# Patient Record
Sex: Male | Born: 1950 | Race: White | Hispanic: No | Marital: Married | Smoking: Former smoker
Health system: Southern US, Community
[De-identification: ages and names within clinical notes are randomized; demographics above are authoritative.]

## PROBLEM LIST (undated history)

## (undated) DIAGNOSIS — N400 Enlarged prostate without lower urinary tract symptoms: Secondary | ICD-10-CM

## (undated) DIAGNOSIS — N189 Chronic kidney disease, unspecified: Secondary | ICD-10-CM

## (undated) DIAGNOSIS — E119 Type 2 diabetes mellitus without complications: Secondary | ICD-10-CM

## (undated) DIAGNOSIS — E669 Obesity, unspecified: Secondary | ICD-10-CM

## (undated) DIAGNOSIS — E538 Deficiency of other specified B group vitamins: Secondary | ICD-10-CM

## (undated) DIAGNOSIS — R739 Hyperglycemia, unspecified: Secondary | ICD-10-CM

## (undated) DIAGNOSIS — I2510 Atherosclerotic heart disease of native coronary artery without angina pectoris: Secondary | ICD-10-CM

## (undated) DIAGNOSIS — G4733 Obstructive sleep apnea (adult) (pediatric): Secondary | ICD-10-CM

## (undated) DIAGNOSIS — Z720 Tobacco use: Secondary | ICD-10-CM

## (undated) DIAGNOSIS — E559 Vitamin D deficiency, unspecified: Secondary | ICD-10-CM

## (undated) DIAGNOSIS — M1990 Unspecified osteoarthritis, unspecified site: Secondary | ICD-10-CM

## (undated) DIAGNOSIS — Z8719 Personal history of other diseases of the digestive system: Secondary | ICD-10-CM

## (undated) DIAGNOSIS — I10 Essential (primary) hypertension: Secondary | ICD-10-CM

## (undated) HISTORY — PX: FLEXIBLE SIGMOIDOSCOPY: SHX1649

## (undated) HISTORY — PX: TONSILLECTOMY: SUR1361

## (undated) HISTORY — PX: COLONOSCOPY: SHX174

## (undated) HISTORY — PX: OTHER SURGICAL HISTORY: SHX169

---

## 2005-09-22 ENCOUNTER — Ambulatory Visit: Payer: Self-pay | Admitting: Internal Medicine

## 2008-12-01 ENCOUNTER — Ambulatory Visit: Payer: Self-pay | Admitting: Unknown Physician Specialty

## 2011-09-20 HISTORY — PX: CORONARY ARTERY BYPASS GRAFT: SHX141

## 2012-02-22 ENCOUNTER — Other Ambulatory Visit: Payer: Self-pay | Admitting: Rheumatology

## 2012-02-22 LAB — BODY FLUID CELL COUNT WITH DIFFERENTIAL
Basophil: 0 %
Eosinophil: 0 %
Lymphocytes: 92 %
Neutrophils: 3 %
Nucleated Cell Count: 103 /mm3

## 2012-02-22 LAB — SYNOVIAL FLUID, CRYSTAL

## 2012-04-16 ENCOUNTER — Ambulatory Visit: Payer: Self-pay | Admitting: Unknown Physician Specialty

## 2012-08-27 ENCOUNTER — Ambulatory Visit: Payer: Self-pay | Admitting: Internal Medicine

## 2012-10-24 ENCOUNTER — Encounter: Payer: Self-pay | Admitting: Internal Medicine

## 2012-11-17 ENCOUNTER — Encounter: Payer: Self-pay | Admitting: Internal Medicine

## 2012-12-18 ENCOUNTER — Encounter: Payer: Self-pay | Admitting: Internal Medicine

## 2015-08-12 ENCOUNTER — Other Ambulatory Visit: Payer: Self-pay | Admitting: Internal Medicine

## 2015-08-12 DIAGNOSIS — M25561 Pain in right knee: Secondary | ICD-10-CM

## 2015-08-12 DIAGNOSIS — R9389 Abnormal findings on diagnostic imaging of other specified body structures: Secondary | ICD-10-CM

## 2015-08-21 ENCOUNTER — Ambulatory Visit
Admission: RE | Admit: 2015-08-21 | Discharge: 2015-08-21 | Disposition: A | Discharge status: Home / Self Care | Payer: Medicare Other | Source: Ambulatory Visit | Attending: Internal Medicine | Admitting: Internal Medicine

## 2015-08-21 DIAGNOSIS — M25561 Pain in right knee: Secondary | ICD-10-CM | POA: Diagnosis present

## 2015-08-21 DIAGNOSIS — R9389 Abnormal findings on diagnostic imaging of other specified body structures: Secondary | ICD-10-CM

## 2015-08-21 DIAGNOSIS — R938 Abnormal findings on diagnostic imaging of other specified body structures: Secondary | ICD-10-CM | POA: Diagnosis present

## 2015-08-21 DIAGNOSIS — I2510 Atherosclerotic heart disease of native coronary artery without angina pectoris: Secondary | ICD-10-CM | POA: Diagnosis not present

## 2015-08-21 HISTORY — DX: Essential (primary) hypertension: I10

## 2015-08-21 MED ORDER — IOHEXOL 300 MG/ML  SOLN
75.0000 mL | Freq: Once | INTRAMUSCULAR | Status: AC | PRN
  Administered 2015-08-21: 75 mL via INTRAVENOUS

## 2016-12-13 ENCOUNTER — Other Ambulatory Visit: Payer: Self-pay | Admitting: Internal Medicine

## 2016-12-13 DIAGNOSIS — R1310 Dysphagia, unspecified: Secondary | ICD-10-CM

## 2016-12-22 ENCOUNTER — Ambulatory Visit
Admission: RE | Admit: 2016-12-22 | Discharge: 2016-12-22 | Disposition: A | Discharge status: Home / Self Care | Payer: Medicare Other | Source: Ambulatory Visit | Attending: Internal Medicine | Admitting: Internal Medicine

## 2016-12-22 DIAGNOSIS — R1310 Dysphagia, unspecified: Secondary | ICD-10-CM

## 2016-12-22 DIAGNOSIS — K228 Other specified diseases of esophagus: Secondary | ICD-10-CM | POA: Insufficient documentation

## 2016-12-22 DIAGNOSIS — K449 Diaphragmatic hernia without obstruction or gangrene: Secondary | ICD-10-CM | POA: Insufficient documentation

## 2016-12-29 ENCOUNTER — Other Ambulatory Visit: Payer: Self-pay | Admitting: Internal Medicine

## 2016-12-29 DIAGNOSIS — M5416 Radiculopathy, lumbar region: Secondary | ICD-10-CM

## 2017-01-09 ENCOUNTER — Ambulatory Visit
Admission: RE | Admit: 2017-01-09 | Discharge: 2017-01-09 | Disposition: A | Discharge status: Home / Self Care | Payer: Medicare Other | Source: Ambulatory Visit | Attending: Internal Medicine | Admitting: Internal Medicine

## 2017-01-09 DIAGNOSIS — M5416 Radiculopathy, lumbar region: Secondary | ICD-10-CM

## 2017-01-17 ENCOUNTER — Ambulatory Visit
Admission: RE | Admit: 2017-01-17 | Discharge: 2017-01-17 | Disposition: A | Discharge status: Home / Self Care | Payer: Medicare Other | Source: Ambulatory Visit | Attending: Internal Medicine | Admitting: Internal Medicine

## 2017-01-17 DIAGNOSIS — M5416 Radiculopathy, lumbar region: Secondary | ICD-10-CM | POA: Diagnosis present

## 2017-01-17 DIAGNOSIS — M48061 Spinal stenosis, lumbar region without neurogenic claudication: Secondary | ICD-10-CM | POA: Insufficient documentation

## 2017-01-17 DIAGNOSIS — M5136 Other intervertebral disc degeneration, lumbar region: Secondary | ICD-10-CM | POA: Insufficient documentation

## 2017-07-21 ENCOUNTER — Encounter: Payer: Self-pay | Admitting: *Deleted

## 2017-07-24 ENCOUNTER — Ambulatory Visit: Payer: Medicare Other | Admitting: Anesthesiology

## 2017-07-24 ENCOUNTER — Ambulatory Visit
Admission: RE | Admit: 2017-07-24 | Discharge: 2017-07-24 | Disposition: A | Discharge status: Home / Self Care | Payer: Medicare Other | Source: Ambulatory Visit | Attending: Unknown Physician Specialty | Admitting: Unknown Physician Specialty

## 2017-07-24 ENCOUNTER — Encounter
Admission: RE | Disposition: A | Discharge status: Home / Self Care | Payer: Self-pay | Source: Ambulatory Visit | Attending: Unknown Physician Specialty

## 2017-07-24 ENCOUNTER — Encounter: Payer: Self-pay | Admitting: *Deleted

## 2017-07-24 DIAGNOSIS — E669 Obesity, unspecified: Secondary | ICD-10-CM | POA: Diagnosis not present

## 2017-07-24 DIAGNOSIS — Z79899 Other long term (current) drug therapy: Secondary | ICD-10-CM | POA: Insufficient documentation

## 2017-07-24 DIAGNOSIS — I2510 Atherosclerotic heart disease of native coronary artery without angina pectoris: Secondary | ICD-10-CM | POA: Diagnosis not present

## 2017-07-24 DIAGNOSIS — G4733 Obstructive sleep apnea (adult) (pediatric): Secondary | ICD-10-CM | POA: Insufficient documentation

## 2017-07-24 DIAGNOSIS — E559 Vitamin D deficiency, unspecified: Secondary | ICD-10-CM | POA: Diagnosis not present

## 2017-07-24 DIAGNOSIS — K635 Polyp of colon: Secondary | ICD-10-CM | POA: Insufficient documentation

## 2017-07-24 DIAGNOSIS — N189 Chronic kidney disease, unspecified: Secondary | ICD-10-CM | POA: Diagnosis not present

## 2017-07-24 DIAGNOSIS — Z6830 Body mass index (BMI) 30.0-30.9, adult: Secondary | ICD-10-CM | POA: Insufficient documentation

## 2017-07-24 DIAGNOSIS — Z7982 Long term (current) use of aspirin: Secondary | ICD-10-CM | POA: Diagnosis not present

## 2017-07-24 DIAGNOSIS — M1990 Unspecified osteoarthritis, unspecified site: Secondary | ICD-10-CM | POA: Diagnosis not present

## 2017-07-24 DIAGNOSIS — Z86010 Personal history of colonic polyps: Secondary | ICD-10-CM | POA: Diagnosis not present

## 2017-07-24 DIAGNOSIS — K640 First degree hemorrhoids: Secondary | ICD-10-CM | POA: Insufficient documentation

## 2017-07-24 DIAGNOSIS — Q438 Other specified congenital malformations of intestine: Secondary | ICD-10-CM | POA: Insufficient documentation

## 2017-07-24 DIAGNOSIS — Z1211 Encounter for screening for malignant neoplasm of colon: Secondary | ICD-10-CM | POA: Insufficient documentation

## 2017-07-24 DIAGNOSIS — Z951 Presence of aortocoronary bypass graft: Secondary | ICD-10-CM | POA: Diagnosis not present

## 2017-07-24 DIAGNOSIS — Z888 Allergy status to other drugs, medicaments and biological substances status: Secondary | ICD-10-CM | POA: Insufficient documentation

## 2017-07-24 DIAGNOSIS — N400 Enlarged prostate without lower urinary tract symptoms: Secondary | ICD-10-CM | POA: Insufficient documentation

## 2017-07-24 DIAGNOSIS — I129 Hypertensive chronic kidney disease with stage 1 through stage 4 chronic kidney disease, or unspecified chronic kidney disease: Secondary | ICD-10-CM | POA: Diagnosis not present

## 2017-07-24 DIAGNOSIS — E538 Deficiency of other specified B group vitamins: Secondary | ICD-10-CM | POA: Insufficient documentation

## 2017-07-24 HISTORY — DX: Hyperglycemia, unspecified: R73.9

## 2017-07-24 HISTORY — DX: Obstructive sleep apnea (adult) (pediatric): G47.33

## 2017-07-24 HISTORY — DX: Chronic kidney disease, unspecified: N18.9

## 2017-07-24 HISTORY — DX: Tobacco use: Z72.0

## 2017-07-24 HISTORY — DX: Unspecified osteoarthritis, unspecified site: M19.90

## 2017-07-24 HISTORY — DX: Benign prostatic hyperplasia without lower urinary tract symptoms: N40.0

## 2017-07-24 HISTORY — DX: Vitamin D deficiency, unspecified: E55.9

## 2017-07-24 HISTORY — DX: Atherosclerotic heart disease of native coronary artery without angina pectoris: I25.10

## 2017-07-24 HISTORY — DX: Deficiency of other specified B group vitamins: E53.8

## 2017-07-24 HISTORY — PX: COLONOSCOPY WITH PROPOFOL: SHX5780

## 2017-07-24 SURGERY — COLONOSCOPY WITH PROPOFOL
Anesthesia: General

## 2017-07-24 MED ORDER — LIDOCAINE 2% (20 MG/ML) 5 ML SYRINGE
INTRAMUSCULAR | Status: DC | PRN
  Administered 2017-07-24: 40 mg via INTRAVENOUS

## 2017-07-24 MED ORDER — FENTANYL CITRATE (PF) 100 MCG/2ML IJ SOLN
INTRAMUSCULAR | Status: DC | PRN
  Administered 2017-07-24 (×2): 50 ug via INTRAVENOUS

## 2017-07-24 MED ORDER — PROPOFOL 10 MG/ML IV BOLUS
INTRAVENOUS | Status: AC
  Filled 2017-07-24: qty 20

## 2017-07-24 MED ORDER — PROPOFOL 10 MG/ML IV BOLUS
INTRAVENOUS | Status: DC | PRN
  Administered 2017-07-24: 100 mg via INTRAVENOUS

## 2017-07-24 MED ORDER — PROPOFOL 500 MG/50ML IV EMUL
INTRAVENOUS | Status: DC | PRN
  Administered 2017-07-24: 160 ug/kg/min via INTRAVENOUS

## 2017-07-24 MED ORDER — FENTANYL CITRATE (PF) 100 MCG/2ML IJ SOLN
INTRAMUSCULAR | Status: AC
  Filled 2017-07-24: qty 2

## 2017-07-24 MED ORDER — SODIUM CHLORIDE 0.9 % IV SOLN
INTRAVENOUS | Status: DC
  Administered 2017-07-24 (×2): via INTRAVENOUS

## 2017-07-24 NOTE — Anesthesia Postprocedure Evaluation (Signed)
Anesthesia Post Note  Patient: Andrew Marquez  Procedure(s) Performed: COLONOSCOPY WITH PROPOFOL (N/A )  Patient location during evaluation: Endoscopy Anesthesia Type: General Level of consciousness: awake and alert Pain management: pain level controlled Vital Signs Assessment: post-procedure vital signs reviewed and stable Respiratory status: spontaneous breathing and respiratory function stable Cardiovascular status: stable Anesthetic complications: no     Last Vitals:  Vitals:   07/24/17 1650 07/24/17 1659  BP: 134/83 (!) 146/98  Pulse: 68 65  Resp: 11 11  Temp: (!) 36.4 C   SpO2: 97% 98%    Last Pain:  Vitals:   07/24/17 1649  TempSrc: Tympanic                 Ziaire Hagos K

## 2017-07-24 NOTE — Op Note (Signed)
[facility] Gastroenterology Patient Name: Andrew Marquez Procedure Date: 07/24/2017 4:04 PM MRN: [id number] Account #: [id number] Date of Birth: 10/21/1950 Admit Type: Outpatient Age: 66 Room: [facility] ENDO ROOM 1 Gender: Male Note Status: Finalized Procedure:            Colonoscopy Indications:          High risk colon cancer surveillance: Personal history                        of colonic polyps Providers:            Manya Silvas, MD Referring MD:         Leonie Douglas. Doy Hutching, MD (Referring MD) Medicines:            Propofol per Anesthesia Complications:        No immediate complications. Procedure:            Pre-Anesthesia Assessment:                       - After reviewing the risks and benefits, the patient                        was deemed in satisfactory condition to undergo the                        procedure.                       After obtaining informed consent, the colonoscope was                        passed under direct vision. Throughout the procedure,                        the patient's blood pressure, pulse, and oxygen                        saturations were monitored continuously. The                        Colonoscope was introduced through the anus and                        advanced to the the cecum, identified by appendiceal                        orifice and ileocecal valve. The colonoscopy was                        somewhat difficult due to a tortuous colon. Successful                        completion of the procedure was aided by applying                        abdominal pressure. The patient tolerated the procedure                        well. The quality of the bowel preparation was good. Findings:      A diminutive polyp was found in the sigmoid colon. The  polyp was       sessile. The polyp was removed with a jumbo cold forceps. Resection and       retrieval were complete.      Internal hemorrhoids were found during endoscopy.  The hemorrhoids were       small and Grade I (internal hemorrhoids that do not prolapse).      The exam was otherwise without abnormality. Impression:           - One diminutive polyp in the sigmoid colon, removed                        with a jumbo cold forceps. Resected and retrieved.                       - Internal hemorrhoids.                       - The examination was otherwise normal. Recommendation:       - Await pathology results. Manya Silvas, MD 07/24/2017 4:41:18 PM This report has been signed electronically. Number of Addenda: 0 Note Initiated On: 07/24/2017 4:04 PM Scope Withdrawal Time: 0 hours 10 minutes 23 seconds  Total Procedure Duration: 0 hours 27 minutes 50 seconds       [facility]

## 2017-07-24 NOTE — Anesthesia Preprocedure Evaluation (Signed)
Anesthesia Evaluation  Patient identified by MRN, date of birth, ID band Patient awake    Reviewed: Allergy & Precautions, NPO status , Patient's Chart, lab work & pertinent test results  History of Anesthesia Complications Negative for: history of anesthetic complications  Airway Mallampati: II  TM Distance: >3 FB Neck ROM: Full    Dental no notable dental hx.    Pulmonary sleep apnea and Continuous Positive Airway Pressure Ventilation , neg COPD,    breath sounds clear to auscultation- rhonchi (-) wheezing      Cardiovascular hypertension, + CAD and + CABG (2013)  (-) Past MI and (-) Cardiac Stents  Rhythm:Regular Rate:Normal - Systolic murmurs and - Diastolic murmurs    Neuro/Psych negative neurological ROS  negative psych ROS   GI/Hepatic negative GI ROS, Neg liver ROS,   Endo/Other  negative endocrine ROSneg diabetes  Renal/GU Renal InsufficiencyRenal disease     Musculoskeletal  (+) Arthritis ,   Abdominal (+) + obese,   Peds  Hematology negative hematology ROS (+)   Anesthesia Other Findings Past Medical History: No date: B12 deficiency No date: BPH (benign prostatic hyperplasia) No date: CAD (coronary artery disease) No date: CKD (chronic kidney disease) No date: Hyperglycemia No date: Hypertension No date: OSA (obstructive sleep apnea) No date: Osteoarthritis No date: Tobacco abuse No date: Vitamin D deficiency   Reproductive/Obstetrics                             Anesthesia Physical Anesthesia Plan  ASA: III  Anesthesia Plan: General   Post-op Pain Management:    Induction: Intravenous  PONV Risk Score and Plan: 1 and Propofol infusion  Airway Management Planned: Natural Airway  Additional Equipment:   Intra-op Plan:   Post-operative Plan:   Informed Consent: I have reviewed the patients History and Physical, chart, labs and discussed the procedure  including the risks, benefits and alternatives for the proposed anesthesia with the patient or authorized representative who has indicated his/her understanding and acceptance.   Dental advisory given  Plan Discussed with: CRNA and Anesthesiologist  Anesthesia Plan Comments:         Anesthesia Quick Evaluation

## 2017-07-24 NOTE — Transfer of Care (Signed)
Immediate Anesthesia Transfer of Care Note  Patient: Andrew Marquez  Procedure(s) Performed: COLONOSCOPY WITH PROPOFOL (N/A )  Patient Location: PACU and Endoscopy Unit  Anesthesia Type:General  Level of Consciousness: awake, oriented and patient cooperative  Airway & Oxygen Therapy: Patient Spontanous Breathing and Patient connected to nasal cannula oxygen  Post-op Assessment: Report given to RN and Post -op Vital signs reviewed and stable  Post vital signs: Reviewed and stable  Last Vitals:  Vitals:   07/24/17 1442 07/24/17 1649  BP: (!) 145/92   Pulse: 66   Resp: 20   Temp: 36.8 C (!) 36.4 C  SpO2: 99%     Last Pain:  Vitals:   07/24/17 1649  TempSrc: Tympanic         Complications: No apparent anesthesia complications

## 2017-07-24 NOTE — Anesthesia Post-op Follow-up Note (Signed)
Anesthesia QCDR form completed.        

## 2017-07-27 LAB — SURGICAL PATHOLOGY

## 2017-07-28 NOTE — H&P (Signed)
   Primary Care Physician:  Idelle Crouch, MD Primary Gastroenterologist:  Dr. Vira Agar  Pre-Procedure History & Physical: HPI:  Andrew Marquez is a 66 y.o. male is here for an colonoscopy.   Past Medical History:  Diagnosis Date  . B12 deficiency   . BPH (benign prostatic hyperplasia)   . CAD (coronary artery disease)   . CKD (chronic kidney disease)   . Hyperglycemia   . Hypertension   . OSA (obstructive sleep apnea)   . Osteoarthritis   . Tobacco abuse   . Vitamin D deficiency     Past Surgical History:  Procedure Laterality Date  . Cardiac Bypass    . COLONOSCOPY    . FLEXIBLE SIGMOIDOSCOPY    . TONSILLECTOMY    . Transesophageal Echocardiography      Prior to Admission medications   Medication Sig Start Date End Date Taking? Authorizing Provider  amLODipine-benazepril (LOTREL) 5-40 MG capsule Take 1 capsule by mouth daily.   Yes [provider]  aspirin EC 81 MG tablet Take 81 mg by mouth daily.   Yes [provider]  pravastatin (PRAVACHOL) 80 MG tablet Take 80 mg by mouth daily.   Yes [provider]    Allergies as of 05/15/2017  . (No Known Allergies)    History reviewed. No pertinent family history.  Social History   Socioeconomic History  . Marital status: Married    Spouse name: Not on file  . Number of children: Not on file  . Years of education: Not on file  . Highest education level: Not on file  Social Needs  . Financial resource strain: Not on file  . Food insecurity - worry: Not on file  . Food insecurity - inability: Not on file  . Transportation needs - medical: Not on file  . Transportation needs - non-medical: Not on file  Occupational History  . Not on file  Tobacco Use  . Smoking status: Never Smoker  . Smokeless tobacco: Never Used  Substance and Sexual Activity  . Alcohol use: Yes    Comment: occasional  . Drug use: No  . Sexual activity: Not on file  Other Topics Concern  . Not on file   Social History Narrative  . Not on file    Review of Systems: See HPI, otherwise negative ROS  Physical Exam: BP (!) 146/98   Pulse 65   Temp (!) 97.5 F (36.4 C)   Resp 11   Ht 5\' 8"  (1.727 m)   Wt 90.7 kg (200 lb)   SpO2 98%   BMI 30.41 kg/m  General:   Alert,  pleasant and cooperative in NAD Head:  Normocephalic and atraumatic. Neck:  Supple; no masses or thyromegaly. Lungs:  Clear throughout to auscultation.    Heart:  Regular rate and rhythm. Abdomen:  Soft, nontender and nondistended. Normal bowel sounds, without guarding, and without rebound.   Neurologic:  Alert and  oriented x4;  grossly normal neurologically.  Impression/Plan: Andrew Marquez is here for an colonoscopy to be performed for [facility] colon polyps.  Risks, benefits, limitations, and alternatives regarding  colonoscopy have been reviewed with the patient.  Questions have been answered.  All parties agreeable.   Gaylyn Cheers, MD  07/28/2017, 12:44 PM

## 2019-11-02 ENCOUNTER — Ambulatory Visit: Payer: Medicare Other | Attending: Internal Medicine

## 2019-11-02 DIAGNOSIS — Z23 Encounter for immunization: Secondary | ICD-10-CM | POA: Insufficient documentation

## 2019-11-02 NOTE — Progress Notes (Signed)
   Covid-19 Vaccination Clinic  Name:  Andrew Marquez    MRN: [id number] DOB: 07-Jul-1951  11/02/2019  Andrew Marquez was observed post Covid-19 immunization for 30 minutes based on pre-vaccination screening without incidence. He was provided with Vaccine Information Sheet and instruction to access the V-Safe system.   Andrew Marquez was instructed to call 911 with any severe reactions post vaccine: Marland Kitchen Difficulty breathing  . Swelling of your face and throat  . A fast heartbeat  . A bad rash all over your body  . Dizziness and weakness    Immunizations Administered    Name Date Dose VIS Date Route   Pfizer COVID-19 Vaccine 11/02/2019 10:12 AM 0.3 mL 08/30/2019 Intramuscular   Manufacturer: [place]   Lot: [id number]   [place]: [id number]

## 2019-11-23 ENCOUNTER — Ambulatory Visit: Payer: Medicare Other | Attending: Internal Medicine

## 2019-11-23 DIAGNOSIS — Z23 Encounter for immunization: Secondary | ICD-10-CM

## 2019-11-23 NOTE — Progress Notes (Signed)
   Covid-19 Vaccination Clinic  Name:  Andrew Marquez    MRN: [id number] DOB: 16-Mar-1951  11/23/2019  Andrew Marquez was observed post Covid-19 immunization for 15 minutes without incident. He was provided with Vaccine Information Sheet and instruction to access the V-Safe system.   Andrew Marquez was instructed to call 911 with any severe reactions post vaccine: Marland Kitchen Difficulty breathing  . Swelling of face and throat  . A fast heartbeat  . A bad rash all over body  . Dizziness and weakness   Immunizations Administered    Name Date Dose VIS Date Route   Pfizer COVID-19 Vaccine 11/23/2019  8:23 AM 0.3 mL 08/30/2019 Intramuscular   Manufacturer: [place]   Lot: [id number]   [place]: [id number]

## 2020-03-26 ENCOUNTER — Other Ambulatory Visit: Payer: Self-pay | Admitting: Internal Medicine

## 2020-03-26 DIAGNOSIS — M79652 Pain in left thigh: Secondary | ICD-10-CM

## 2020-04-06 ENCOUNTER — Ambulatory Visit
Admission: RE | Admit: 2020-04-06 | Discharge: 2020-04-06 | Disposition: A | Discharge status: Home / Self Care | Payer: Medicare Other | Source: Ambulatory Visit | Attending: Internal Medicine | Admitting: Internal Medicine

## 2020-04-06 ENCOUNTER — Other Ambulatory Visit: Payer: Self-pay

## 2020-04-06 DIAGNOSIS — M79652 Pain in left thigh: Secondary | ICD-10-CM | POA: Diagnosis not present

## 2020-06-18 ENCOUNTER — Other Ambulatory Visit: Payer: Self-pay | Admitting: Orthopedic Surgery

## 2020-06-18 DIAGNOSIS — M12811 Other specific arthropathies, not elsewhere classified, right shoulder: Secondary | ICD-10-CM

## 2020-07-12 ENCOUNTER — Ambulatory Visit
Admission: RE | Admit: 2020-07-12 | Discharge: 2020-07-12 | Disposition: A | Discharge status: Home / Self Care | Payer: Medicare Other | Source: Ambulatory Visit | Attending: Orthopedic Surgery | Admitting: Orthopedic Surgery

## 2020-07-12 ENCOUNTER — Other Ambulatory Visit: Payer: Self-pay

## 2020-07-12 DIAGNOSIS — M75101 Unspecified rotator cuff tear or rupture of right shoulder, not specified as traumatic: Secondary | ICD-10-CM

## 2020-07-14 ENCOUNTER — Other Ambulatory Visit: Payer: 59

## 2020-08-02 ENCOUNTER — Other Ambulatory Visit: Payer: 59

## 2020-08-07 ENCOUNTER — Other Ambulatory Visit: Payer: Self-pay | Admitting: Surgery

## 2020-08-19 ENCOUNTER — Other Ambulatory Visit: Payer: Self-pay

## 2020-08-19 ENCOUNTER — Other Ambulatory Visit
Admission: RE | Admit: 2020-08-19 | Discharge: 2020-08-19 | Disposition: A | Discharge status: Home / Self Care | Payer: Medicare Other | Source: Ambulatory Visit | Attending: Surgery | Admitting: Surgery

## 2020-08-19 DIAGNOSIS — Z01812 Encounter for preprocedural laboratory examination: Secondary | ICD-10-CM | POA: Insufficient documentation

## 2020-08-19 HISTORY — DX: Type 2 diabetes mellitus without complications: E11.9

## 2020-08-19 HISTORY — DX: Obesity, unspecified: E66.9

## 2020-08-19 HISTORY — DX: Personal history of other diseases of the digestive system: Z87.19

## 2020-08-19 NOTE — Patient Instructions (Addendum)
INSTRUCTIONS FOR SURGERY     Your surgery is scheduled for:   Wednesday, December 22nd      To find out your arrival time for the day of surgery,          please call [phone] between 1 pm and 3 pm on :  Tuesday, December 21st     When you arrive for surgery, report to the [facility].  ONCE YOU         ARE DONE THERE, YOU WILL GO TO THE SECOND FLOOR SURGICAL DESK TO          SIGN IN.     REMEMBER: Instructions that are not followed completely may result in serious medical risk,  up to and including death, or upon the discretion of your surgeon and anesthesiologist,            your surgery may need to be rescheduled.  __X__ 1. Do not eat food after midnight the night before your procedure.                    No gum, candy, lozenger, tic tacs, tums or hard candies.                  ABSOLUTELY NOTHING SOLID IN YOUR MOUTH AFTER MIDNIGHT                    You may drink unlimited clear liquids up to 2 hours before you are scheduled to arrive for surgery.                   Do not drink anything within those 2 hours unless you need to take medicine, then take the                   smallest amount you need.  Clear liquids include:  water, apple juice without pulp,                   any flavor Gatorade, Black coffee, black tea.  Sugar may be added but no dairy/ honey /lemon.                        Broth and jello is not considered a clear liquid.  __x__  2. On the morning of surgery, please brush your teeth with toothpaste and water. You may rinse with                  mouthwash if you wish but DO NOT SWALLOW TOOTHPASTE OR MOUTHWASH  __X___3. NO alcohol for 24 hours before or after surgery.  __x___ 4.  Do NOT smoke or use e-cigarettes for 24 HOURS PRIOR TO SURGERY.                      DO NOT Use any chewable tobacco products for at least 6 hours prior to surgery.  __x___ 5. If you start any new medication after  this appointment and prior to surgery, please                   Bring  it with you on the day of surgery.  ___x__ 6. Notify your doctor if there is any change in your medical condition, such as fever,                   infection, vomitting, diarrhea or any open sores.  __x___ 7.  USE the CHG SOAP as instructed, the night before surgery and the day of surgery.                   Once you have washed with this soap, do NOT use any of the following: Powders, perfumes                    or lotions. Please do not wear make up, hairpins, clips or nail polish. You MAY NOT wear deodorant.                   Men may shave their face and neck.  Women need to shave 48 hours prior to surgery.                   DO NOT wear ANY jewelry on the day of surgery. If there are rings that are too tight to                    remove easily, please address this prior to the surgery day. Piercings need to be removed.                                                                     NO METAL ON YOUR BODY.                    Do NOT bring any valuables.  You will need your  license, insurance card and method of payment.                    [facility] IS NOT RESPONSIBLE FOR BELONGINGS OR VALUABLES.  ___X__ 8. DO NOT wear contact lenses on surgery day.  You may not have dentures,                     Hearing aides, contacts or glasses in the operating room. These items can be                    Placed in the Recovery Room to receive immediately after surgery.  __x___ 9. IF YOU ARE SCHEDULED TO GO HOME ON THE SAME DAY, YOU MUST                   Have someone to drive you home and to stay with you  for the first 24 hours.                    Have an arrangement prior to arriving on surgery day.  ___x__ 10. Take the following medications on the morning of surgery with a sip of water:                              1. NOTHING  2.                     3.  _____ 11.  Follow any instructions provided to you by  your surgeon.                        Such as enema, clear liquid bowel prep  __X__  12. STOP ASPIRIN AND ALL ASPIRIN PRODUCTS AS OF 08/19/20.                       THIS INCLUDES BC POWDERS / GOODIES POWDER  __x___ 13. STOP Anti-inflammatories as of:  08/19/20                      This includes IBUPROFEN / MOTRIN / ADVIL / ALEVE/ NAPROXYN                    YOU MAY TAKE TYLENOL ANY TIME PRIOR TO SURGERY.  _X____ 14.  Stop supplements until after surgery.                     This includes: [place] may continue taking Vitamin B12 / Vitamin D3 but do not take on the morning of surgery.  __X___ 15. Bring your CPAP machine into preop with you on the morning of surgery.  __X____16.  CONTINUE TAKING GLIMEPIRIDE BUT DO NOT TAKE ON DAY OF SURGERY.                     Do NOT take any diabetes medications on surgery day.  __X____17.  Continue to take the following medications but do not take on the morning of surgery:                           GLIMEPIRIDE.  __X____18. Wear clean and comfortable clothing to the hospital.                     MAKE SURE THAT YOUR SHIRT IS LOOSE FITTING AND EASY TO GET YOUR ARM                       INTO THE SLEEVE.   THERE ARE 2 ADVANCE DIRECTIVE BOOKLETS IN THE BAG.  REVIEW THE INFORMATION AND    COMPLETE. ONCE COMPLETED, GET IT NOTARIZED TO BE LEGAL. IF YOU HAVE THIS DONE   PRIOR TO SURGERY, THEN PLEASE BRING IT WITH YOU.

## 2020-08-24 ENCOUNTER — Other Ambulatory Visit
Admission: RE | Admit: 2020-08-24 | Discharge: 2020-08-24 | Disposition: A | Discharge status: Home / Self Care | Payer: Medicare Other | Source: Ambulatory Visit | Attending: Surgery | Admitting: Surgery

## 2020-08-24 ENCOUNTER — Other Ambulatory Visit: Payer: Self-pay

## 2020-08-24 DIAGNOSIS — Z01812 Encounter for preprocedural laboratory examination: Secondary | ICD-10-CM | POA: Diagnosis present

## 2020-08-24 DIAGNOSIS — Z20822 Contact with and (suspected) exposure to covid-19: Secondary | ICD-10-CM | POA: Diagnosis not present

## 2020-08-25 LAB — SARS CORONAVIRUS 2 (TAT 6-24 HRS): SARS Coronavirus 2: NEGATIVE

## 2020-08-26 ENCOUNTER — Ambulatory Visit: Payer: Medicare Other | Admitting: Certified Registered"

## 2020-08-26 ENCOUNTER — Other Ambulatory Visit: Payer: Self-pay

## 2020-08-26 ENCOUNTER — Encounter
Admission: RE | Disposition: A | Discharge status: Home / Self Care | Payer: Self-pay | Source: Home / Self Care | Attending: Surgery

## 2020-08-26 ENCOUNTER — Ambulatory Visit
Admission: RE | Admit: 2020-08-26 | Discharge: 2020-08-26 | Disposition: A | Discharge status: Home / Self Care | Payer: Medicare Other | Attending: Surgery | Admitting: Surgery

## 2020-08-26 ENCOUNTER — Encounter: Payer: Self-pay | Admitting: Surgery

## 2020-08-26 ENCOUNTER — Ambulatory Visit: Payer: Medicare Other

## 2020-08-26 DIAGNOSIS — Z833 Family history of diabetes mellitus: Secondary | ICD-10-CM | POA: Diagnosis not present

## 2020-08-26 DIAGNOSIS — Z888 Allergy status to other drugs, medicaments and biological substances status: Secondary | ICD-10-CM | POA: Insufficient documentation

## 2020-08-26 DIAGNOSIS — M75121 Complete rotator cuff tear or rupture of right shoulder, not specified as traumatic: Secondary | ICD-10-CM | POA: Insufficient documentation

## 2020-08-26 DIAGNOSIS — Z79899 Other long term (current) drug therapy: Secondary | ICD-10-CM | POA: Diagnosis not present

## 2020-08-26 DIAGNOSIS — Z8379 Family history of other diseases of the digestive system: Secondary | ICD-10-CM | POA: Diagnosis not present

## 2020-08-26 DIAGNOSIS — Z8349 Family history of other endocrine, nutritional and metabolic diseases: Secondary | ICD-10-CM | POA: Insufficient documentation

## 2020-08-26 DIAGNOSIS — Z951 Presence of aortocoronary bypass graft: Secondary | ICD-10-CM | POA: Insufficient documentation

## 2020-08-26 DIAGNOSIS — Z8249 Family history of ischemic heart disease and other diseases of the circulatory system: Secondary | ICD-10-CM | POA: Diagnosis not present

## 2020-08-26 DIAGNOSIS — Z807 Family history of other malignant neoplasms of lymphoid, hematopoietic and related tissues: Secondary | ICD-10-CM | POA: Insufficient documentation

## 2020-08-26 DIAGNOSIS — Z7982 Long term (current) use of aspirin: Secondary | ICD-10-CM | POA: Diagnosis not present

## 2020-08-26 DIAGNOSIS — Z7984 Long term (current) use of oral hypoglycemic drugs: Secondary | ICD-10-CM | POA: Insufficient documentation

## 2020-08-26 DIAGNOSIS — Z87891 Personal history of nicotine dependence: Secondary | ICD-10-CM | POA: Insufficient documentation

## 2020-08-26 DIAGNOSIS — I10 Essential (primary) hypertension: Secondary | ICD-10-CM | POA: Insufficient documentation

## 2020-08-26 DIAGNOSIS — I2510 Atherosclerotic heart disease of native coronary artery without angina pectoris: Secondary | ICD-10-CM | POA: Insufficient documentation

## 2020-08-26 DIAGNOSIS — Z419 Encounter for procedure for purposes other than remedying health state, unspecified: Secondary | ICD-10-CM

## 2020-08-26 DIAGNOSIS — M1990 Unspecified osteoarthritis, unspecified site: Secondary | ICD-10-CM | POA: Diagnosis not present

## 2020-08-26 DIAGNOSIS — M7541 Impingement syndrome of right shoulder: Secondary | ICD-10-CM | POA: Insufficient documentation

## 2020-08-26 DIAGNOSIS — Z5309 Procedure and treatment not carried out because of other contraindication: Secondary | ICD-10-CM | POA: Insufficient documentation

## 2020-08-26 DIAGNOSIS — Z806 Family history of leukemia: Secondary | ICD-10-CM | POA: Diagnosis not present

## 2020-08-26 LAB — GLUCOSE, CAPILLARY
Glucose-Capillary: 156 mg/dL — ABNORMAL HIGH (ref 70–99)
Glucose-Capillary: 165 mg/dL — ABNORMAL HIGH (ref 70–99)

## 2020-08-26 SURGERY — SHOULDER ARTHROSCOPY WITH SUBACROMIAL DECOMPRESSION, ROTATOR CUFF REPAIR AND BICEP TENDON REPAIR
Anesthesia: General | Site: Shoulder | Laterality: Right

## 2020-08-26 MED ORDER — FENTANYL CITRATE (PF) 100 MCG/2ML IJ SOLN
INTRAMUSCULAR | Status: AC
  Administered 2020-08-26: 50 ug via INTRAVENOUS
  Filled 2020-08-26: qty 2

## 2020-08-26 MED ORDER — DEXAMETHASONE SODIUM PHOSPHATE 10 MG/ML IJ SOLN
INTRAMUSCULAR | Status: DC | PRN
  Administered 2020-08-26: 10 mg via INTRAVENOUS

## 2020-08-26 MED ORDER — MIDAZOLAM HCL 2 MG/2ML IJ SOLN
INTRAMUSCULAR | Status: AC
  Administered 2020-08-26: 1 mg via INTRAVENOUS
  Filled 2020-08-26: qty 2

## 2020-08-26 MED ORDER — LACTATED RINGERS IV SOLN: INTRAVENOUS | Status: DC | PRN

## 2020-08-26 MED ORDER — BUPIVACAINE HCL (PF) 0.5 % IJ SOLN
INTRAMUSCULAR | Status: AC
  Filled 2020-08-26: qty 10

## 2020-08-26 MED ORDER — METOCLOPRAMIDE HCL 10 MG PO TABS: 5.0000 mg | ORAL_TABLET | Freq: Three times a day (TID) | ORAL | Status: DC | PRN

## 2020-08-26 MED ORDER — LIDOCAINE HCL (PF) 1 % IJ SOLN
INTRAMUSCULAR | Status: DC | PRN
  Administered 2020-08-26: .8 mL

## 2020-08-26 MED ORDER — LIDOCAINE HCL (PF) 1 % IJ SOLN
INTRAMUSCULAR | Status: AC
  Filled 2020-08-26: qty 5

## 2020-08-26 MED ORDER — ROCURONIUM BROMIDE 100 MG/10ML IV SOLN
INTRAVENOUS | Status: DC | PRN
  Administered 2020-08-26: 30 mg via INTRAVENOUS
  Administered 2020-08-26: 20 mg via INTRAVENOUS

## 2020-08-26 MED ORDER — PROPOFOL 10 MG/ML IV BOLUS
INTRAVENOUS | Status: AC
  Filled 2020-08-26: qty 20

## 2020-08-26 MED ORDER — BUPIVACAINE HCL (PF) 0.5 % IJ SOLN
INTRAMUSCULAR | Status: DC | PRN
  Administered 2020-08-26: 10 mL via PERINEURAL

## 2020-08-26 MED ORDER — CEFAZOLIN SODIUM-DEXTROSE 2-4 GM/100ML-% IV SOLN
2.0000 g | INTRAVENOUS | Status: AC
  Administered 2020-08-26: 2 g via INTRAVENOUS

## 2020-08-26 MED ORDER — SODIUM CHLORIDE 0.9 % IV SOLN: INTRAVENOUS | Status: DC

## 2020-08-26 MED ORDER — BUPIVACAINE LIPOSOME 1.3 % IJ SUSP
INTRAMUSCULAR | Status: AC
  Filled 2020-08-26: qty 20

## 2020-08-26 MED ORDER — OXYCODONE HCL 5 MG PO TABS: 5.0000 mg | ORAL_TABLET | ORAL | Status: DC | PRN

## 2020-08-26 MED ORDER — SUCCINYLCHOLINE CHLORIDE 20 MG/ML IJ SOLN
INTRAMUSCULAR | Status: DC | PRN
  Administered 2020-08-26: 100 mg via INTRAVENOUS

## 2020-08-26 MED ORDER — BUPIVACAINE LIPOSOME 1.3 % IJ SUSP
INTRAMUSCULAR | Status: DC | PRN
  Administered 2020-08-26: 20 mL via PERINEURAL

## 2020-08-26 MED ORDER — CHLORHEXIDINE GLUCONATE 0.12 % MT SOLN
OROMUCOSAL | Status: AC
  Filled 2020-08-26: qty 15

## 2020-08-26 MED ORDER — PHENYLEPHRINE HCL (PRESSORS) 10 MG/ML IV SOLN
INTRAVENOUS | Status: AC
  Filled 2020-08-26: qty 1

## 2020-08-26 MED ORDER — BUPIVACAINE-EPINEPHRINE (PF) 0.5% -1:200000 IJ SOLN
INTRAMUSCULAR | Status: AC
  Filled 2020-08-26: qty 120

## 2020-08-26 MED ORDER — DEXAMETHASONE SODIUM PHOSPHATE 10 MG/ML IJ SOLN
INTRAMUSCULAR | Status: AC
  Filled 2020-08-26: qty 1

## 2020-08-26 MED ORDER — CEFAZOLIN SODIUM-DEXTROSE 2-4 GM/100ML-% IV SOLN
INTRAVENOUS | Status: AC
  Filled 2020-08-26: qty 100

## 2020-08-26 MED ORDER — FENTANYL CITRATE (PF) 100 MCG/2ML IJ SOLN: 25.0000 ug | INTRAMUSCULAR | Status: DC | PRN

## 2020-08-26 MED ORDER — FENTANYL CITRATE (PF) 100 MCG/2ML IJ SOLN
50.0000 ug | INTRAMUSCULAR | Status: AC | PRN
  Administered 2020-08-26: 150 ug via INTRAVENOUS
  Administered 2020-08-26: 100 ug via INTRAVENOUS

## 2020-08-26 MED ORDER — ONDANSETRON HCL 4 MG PO TABS: 4.0000 mg | ORAL_TABLET | Freq: Four times a day (QID) | ORAL | Status: DC | PRN

## 2020-08-26 MED ORDER — ONDANSETRON HCL 4 MG/2ML IJ SOLN
INTRAMUSCULAR | Status: AC
  Filled 2020-08-26: qty 2

## 2020-08-26 MED ORDER — EPINEPHRINE PF 1 MG/ML IJ SOLN
INTRAMUSCULAR | Status: AC
  Filled 2020-08-26: qty 1

## 2020-08-26 MED ORDER — ONDANSETRON HCL 4 MG/2ML IJ SOLN: 4.0000 mg | Freq: Once | INTRAMUSCULAR | Status: DC | PRN

## 2020-08-26 MED ORDER — BUPIVACAINE LIPOSOME 1.3 % IJ SUSP: INTRAMUSCULAR | Status: DC | PRN

## 2020-08-26 MED ORDER — FENTANYL CITRATE (PF) 250 MCG/5ML IJ SOLN
INTRAMUSCULAR | Status: AC
  Filled 2020-08-26: qty 5

## 2020-08-26 MED ORDER — PROPOFOL 10 MG/ML IV BOLUS
INTRAVENOUS | Status: DC | PRN
  Administered 2020-08-26: 100 mg via INTRAVENOUS

## 2020-08-26 MED ORDER — ORAL CARE MOUTH RINSE: 15.0000 mL | Freq: Once | OROMUCOSAL | Status: AC

## 2020-08-26 MED ORDER — EPINEPHRINE PF 1 MG/ML IJ SOLN
INTRAMUSCULAR | Status: AC
  Filled 2020-08-26: qty 3

## 2020-08-26 MED ORDER — SUGAMMADEX SODIUM 500 MG/5ML IV SOLN
INTRAVENOUS | Status: DC | PRN
  Administered 2020-08-26: 500 mg via INTRAVENOUS

## 2020-08-26 MED ORDER — OXYCODONE HCL 5 MG PO TABS
5.0000 mg | ORAL_TABLET | ORAL | 0 refills | Status: DC | PRN
Start: 2020-08-26 — End: 2020-09-09

## 2020-08-26 MED ORDER — LIDOCAINE HCL (PF) 2 % IJ SOLN
INTRAMUSCULAR | Status: AC
  Filled 2020-08-26: qty 5

## 2020-08-26 MED ORDER — LIDOCAINE HCL (CARDIAC) PF 100 MG/5ML IV SOSY
PREFILLED_SYRINGE | INTRAVENOUS | Status: DC | PRN
  Administered 2020-08-26: 50 mg via INTRAVENOUS

## 2020-08-26 MED ORDER — ROCURONIUM BROMIDE 10 MG/ML (PF) SYRINGE
PREFILLED_SYRINGE | INTRAVENOUS | Status: AC
  Filled 2020-08-26: qty 10

## 2020-08-26 MED ORDER — MIDAZOLAM HCL 2 MG/2ML IJ SOLN: 1.0000 mg | INTRAMUSCULAR | Status: DC | PRN

## 2020-08-26 MED ORDER — ONDANSETRON HCL 4 MG/2ML IJ SOLN: 4.0000 mg | Freq: Four times a day (QID) | INTRAMUSCULAR | Status: DC | PRN

## 2020-08-26 MED ORDER — CHLORHEXIDINE GLUCONATE 0.12 % MT SOLN
15.0000 mL | Freq: Once | OROMUCOSAL | Status: AC
  Administered 2020-08-26: 15 mL via OROMUCOSAL

## 2020-08-26 MED ORDER — METOCLOPRAMIDE HCL 5 MG/ML IJ SOLN: 5.0000 mg | Freq: Three times a day (TID) | INTRAMUSCULAR | Status: DC | PRN

## 2020-08-26 SURGICAL SUPPLY — 45 items
BIT DRILL JUGRKNT W/NDL BIT2.9 (DRILL) IMPLANT
BLADE FULL RADIUS 3.5 (BLADE) ×3 IMPLANT
BUR ACROMIONIZER 4.0 (BURR) ×3 IMPLANT
CANNULA SHAVER 8MMX76MM (CANNULA) ×3 IMPLANT
CHLORAPREP W/TINT 26 (MISCELLANEOUS) ×3 IMPLANT
COVER MAYO STAND REUSABLE (DRAPES) ×3 IMPLANT
COVER WAND RF STERILE (DRAPES) ×3 IMPLANT
DRAPE IMP U-DRAPE 54X76 (DRAPES) ×6 IMPLANT
DRILL JUGGERKNOT W/NDL BIT 2.9 (DRILL)
ELECT CAUTERY BLADE TIP 2.5 (TIP) ×3
ELECT REM PT RETURN 9FT ADLT (ELECTROSURGICAL) ×3
ELECTRODE CAUTERY BLDE TIP 2.5 (TIP) ×1 IMPLANT
ELECTRODE REM PT RTRN 9FT ADLT (ELECTROSURGICAL) ×1 IMPLANT
GAUZE SPONGE 4X4 12PLY STRL (GAUZE/BANDAGES/DRESSINGS) ×3 IMPLANT
GAUZE XEROFORM 1X8 LF (GAUZE/BANDAGES/DRESSINGS) ×3 IMPLANT
GLOVE BIO SURGEON STRL SZ7.5 (GLOVE) ×6 IMPLANT
GLOVE BIO SURGEON STRL SZ8 (GLOVE) ×6 IMPLANT
GLOVE BIOGEL PI IND STRL 8 (GLOVE) ×1 IMPLANT
GLOVE BIOGEL PI INDICATOR 8 (GLOVE) ×2
GLOVE INDICATOR 8.0 STRL GRN (GLOVE) ×3 IMPLANT
GOWN STRL REUS W/ TWL LRG LVL3 (GOWN DISPOSABLE) ×1 IMPLANT
GOWN STRL REUS W/ TWL XL LVL3 (GOWN DISPOSABLE) ×1 IMPLANT
GOWN STRL REUS W/TWL LRG LVL3 (GOWN DISPOSABLE) ×2
GOWN STRL REUS W/TWL XL LVL3 (GOWN DISPOSABLE) ×2
GRASPER SUT 15 45D LOW PRO (SUTURE) IMPLANT
IV LACTATED RINGER IRRG 3000ML (IV SOLUTION) ×4
IV LR IRRIG 3000ML ARTHROMATIC (IV SOLUTION) ×2 IMPLANT
KIT CANNULA 8X76-LX IN CANNULA (CANNULA) IMPLANT
MANIFOLD NEPTUNE II (INSTRUMENTS) ×6 IMPLANT
MASK FACE SPIDER DISP (MASK) ×3 IMPLANT
MAT ABSORB  FLUID 56X50 GRAY (MISCELLANEOUS) ×2
MAT ABSORB FLUID 56X50 GRAY (MISCELLANEOUS) ×1 IMPLANT
PACK ARTHROSCOPY SHOULDER (MISCELLANEOUS) ×3 IMPLANT
PASSER SUT FIRSTPASS SELF (INSTRUMENTS) IMPLANT
SLING ARM LRG DEEP (SOFTGOODS) ×6 IMPLANT
SLING ULTRA II LG (MISCELLANEOUS) ×3 IMPLANT
STAPLER SKIN PROX 35W (STAPLE) ×3 IMPLANT
STRAP SAFETY 5IN WIDE (MISCELLANEOUS) ×3 IMPLANT
SUT ETHIBOND 0 MO6 C/R (SUTURE) ×3 IMPLANT
SUT ULTRABRAID 2 COBRAID 38 (SUTURE) IMPLANT
SUT VIC AB 2-0 CT1 27 (SUTURE) ×4
SUT VIC AB 2-0 CT1 TAPERPNT 27 (SUTURE) ×2 IMPLANT
TAPE MICROFOAM 4IN (TAPE) ×3 IMPLANT
TUBING ARTHRO INFLOW-ONLY STRL (TUBING) ×3 IMPLANT
WAND WEREWOLF FLOW 90D (MISCELLANEOUS) ×3 IMPLANT

## 2020-08-26 NOTE — Anesthesia Postprocedure Evaluation (Signed)
Anesthesia Post Note  Patient: [name]  Procedure(s) Performed: SHOULDER ARTHROSCOPY WITH DEBRIDEMENT, DECOMPRESSION, ROTATOR CUFF REPAIR AND BICEP TENDON REPAIR (Right Shoulder)  Patient location during evaluation: PACU Anesthesia Type: General Level of consciousness: awake and alert Pain management: pain level controlled Vital Signs Assessment: post-procedure vital signs reviewed and stable Respiratory status: spontaneous breathing and respiratory function stable Cardiovascular status: stable Comments: Unable to intubate pt. Numerous attempt with [place] and Glidescope. Even with Glidescope fiberoptic bronchoscope. Unable to visualize the cords. Able to ventilate pt with some difficulty using 2 hands on mask, during the entire time. No desaurations. Pt given sugamadex and paralysis resolved, moving air well on his own and transferred to PACU. Pt spoken with about the difficulty and recommendations for awake intubation for any subsequent anesthetic events.    No complications documented.   Last Vitals:  Vitals:   08/26/20 0837 08/26/20 0850  BP: 130/78 128/73  Pulse: 68 73  Resp: 12 14  Temp: 36.6 C 36.6 C  SpO2: 95% 94%    Last Pain:  Vitals:   08/26/20 0850  TempSrc: Temporal  PainSc: 1                  Evie Croston K

## 2020-08-26 NOTE — Op Note (Signed)
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  Post-Op Diagnosis:   Same  Procedure:   None  Anesthesia:   Attempted General endotracheal with interscalene block using Exparel placed preoperatively by the anesthesiologist.  Surgeon:   Pascal Lux, MD  Brief clinical note:   The patient is a 69 year old male with a 6-8 month history of right shoulder pain. The patient's symptoms have progressed despite medications, activity modification, etc. The patient's history and examination are consistent with impingement/tendinopathy with a rotator cuff tear. These findings were confirmed by MRI scan. The patient presents at this time for definitive management of these shoulder symptoms.  Procedure:   The patient underwent placement of an interscalene block using Exparel by the anesthesiologist in the preoperative holding area before being brought into the operating room and lain in the supine position. The patient underwent attempted general endotracheal intubation and anesthesia. Despite numerous attempts by 2 anesthesiologists and a nurse anesthetist utilizing multiple intubation options and techniques, the patient could not be intubated. Therefore, the case was canceled and will be rescheduled in 2 weeks, as recommended by the anesthesiologist to allow the throat to heal. The patient was then awakened and returned to the recovery room in satisfactory condition.

## 2020-08-26 NOTE — Discharge Instructions (Addendum)
Orthopedic discharge instructions:   May shower immediately. Apply ice or ice alternating with heat to shoulder as necessary for comfort. Take ibuprofen 600-800 mg TID with meals if tolerated as necessary. Take Tramadol as prescribed when needed.  May supplement with ES Tylenol if necessary. Use shoulder sling regularly for the first several days until the block has worn off, then as necessary for comfort. The surgery will be rescheduled for 2 weeks.  You will be contacted by my scheduling person.     Interscalene Nerve Block with Exparel  1.  For your surgery you have received an Interscalene Nerve Block with Exparel. 2. Nerve Blocks affect many types of nerves, including nerves that control movement, pain and normal sensation.  You may experience feelings such as numbness, tingling, heaviness, weakness or the inability to move your arm or the feeling or sensation that your arm has "fallen asleep". 3. A nerve block with Exparel can last up to 5 days.  Usually the weakness wears off first.  The tingling and heaviness usually wear off next.  Finally you may start to notice pain.  Keep in mind that this may occur in any order.  Once a nerve block starts to wear off it is usually completely gone within 60 minutes. 4. ISNB may cause mild shortness of breath, a hoarse voice, blurry vision, unequal pupils, or drooping of the face on the same side as the nerve block.  These symptoms will usually resolve with the numbness.  Very rarely the procedure itself can cause mild seizures. 5. If needed, your surgeon will give you a prescription for pain medication.  It will take about 60 minutes for the oral pain medication to become fully effective.  So, it is recommended that you start taking this medication before the nerve block first begins to wear off, or when you first begin to feel discomfort. 6. Take your pain medication only as prescribed.  Pain medication can cause sedation and decrease your breathing if  you take more than you need for the level of pain that you have. 7. Nausea is a common side effect of many pain medications.  You may want to eat something before taking your pain medicine to prevent nausea. 8. After an Interscalene nerve block, you cannot feel pain, pressure or extremes in temperature in the effected arm.  Because your arm is numb it is at an increased risk for injury.  To decrease the possibility of injury, please practice the following:  a. While you are awake change the position of your arm frequently to prevent too much pressure on any one area for prolonged periods of time. b.  If you have a cast or tight dressing, check the color or your fingers every couple of hours.  Call your surgeon with the appearance of any discoloration (white or blue). c. If you are given a sling to wear before you go home, please wear it  at all times until the block has completely worn off.  Do not get up at night without your sling. d. Please contact [facility] or your surgeon if you do not begin to regain sensation after 7 days from the surgery.  Anesthesia may be contacted by calling the Same Day Surgery Department, Mon. through Fri., 6 am to 4 pm at [phone].   e. If you experience any other problems or concerns, please contact your surgeon's office. f. If you experience severe or prolonged shortness of breath go to the nearest emergency department.  AMBULATORY  SURGERY  DISCHARGE INSTRUCTIONS   1) The drugs that you were given will stay in your system until tomorrow so for the next 24 hours you should not:  A) Drive an automobile B) Make any legal decisions C) Drink any alcoholic beverage   2) You may resume regular meals tomorrow.  Today it is better to start with liquids and gradually work up to solid foods.  You may eat anything you prefer, but it is better to start with liquids, then soup and crackers, and gradually work up to solid foods.   3) Please notify your doctor  immediately if you have any unusual bleeding, trouble breathing, redness and pain at the surgery site, drainage, fever, or pain not relieved by medication.    4) Additional Instructions:    Bring Sling Shot sling you brought with you to the hospital today with you to the hospital on the day your surgery is rescheduled.  Oxycodone script not given postop since your surgery was cancelled (not written by MD)        Please contact your physician with any problems or Same Day Surgery at [phone], Monday through Friday 6 am to 4 pm, or [place] at [facility] number at [phone].

## 2020-08-26 NOTE — Transfer of Care (Signed)
Immediate Anesthesia Transfer of Care Note  Patient: Andrew Marquez  Procedure(s) Performed: SHOULDER ARTHROSCOPY WITH DEBRIDEMENT, DECOMPRESSION, ROTATOR CUFF REPAIR AND BICEP TENDON REPAIR (Right Shoulder)  Patient Location: PACU  Anesthesia Type:GA combined with regional for post-op pain  Level of Consciousness: awake  Airway & Oxygen Therapy: Patient Spontanous Breathing and Patient connected to face mask oxygen  Post-op Assessment: Report given to RN and Post -op Vital signs reviewed and stable  Post vital signs: Reviewed and stable  Last Vitals:  Vitals Value Taken Time  BP 147/76 08/26/20 0824  Temp 36.6 C 08/26/20 0824  Pulse 77 08/26/20 0834  Resp 20 08/26/20 0834  SpO2 96 % 08/26/20 0834  Vitals shown include unvalidated device data.  Last Pain:  Vitals:   08/26/20 0824  PainSc: 0-No pain         Complications: No complications documented.

## 2020-08-26 NOTE — H&P (Signed)
History of Present Illness:  Andrew Marquez is a 69 y.o. male who presents today as a result of a referral from [facility], PA-C, for right shoulder pain and weakness.   The patient's symptoms began in April, 2021. Apparently he was trying to set the toilet in a narrow space when he felt a pop in his shoulder. Initially he tried to ignore the symptoms, because of progressively worsening pain, he saw his primary care provider who referred him to orthopedics for further evaluation and treatment. He received a steroid injection by [name], PA-C, in April which he states provided substantial relief of his symptoms and lasted 4 to 5 months before his symptoms began to recur. He received a second steroid injection on 06/18/2020 which also provided moderate relief of his symptoms, but did not last so long. Therefore, the patient was sent for an MRI scan and referred to me for further evaluation and treatment. The patient describes the symptoms as marked (major pain with significant limitations) and have the quality of being aching, miserable, nagging, stabbing and throbbing. The pain is localized to the lateral arm/shoulder. These symptoms are aggravated constantly, with normal daily activities, with sleeping, at higher levels of activity, with overhead activity and reaching behind the back. He has tried acetaminophen with limited benefit. He has tried rest with no significant benefit. He has tried the one injection described above, but has not undergone any formal physical therapy for these symptoms. He denies any neck pain, noted she note any numbness or paresthesias down his arm to his hand. He denies any prior problems with the right shoulder. He is right-handed.  This complaint is not work related. He is a sports non-participant.  Shoulder Surgical History:  The patient has had no shoulder surgery in the past.  PMH/PSH/Family History/Social History/Meds/Allergies:  I have reviewed past medical, surgical,  social and family history, medications and allergies as documented in the EMR.  Current Outpatient Medications: . amLODIPine-benazepril (LOTREL) 5-40 mg capsule Take 1 capsule by mouth once daily 90 capsule 1  . aspirin 81 MG EC tablet Take 81 mg by mouth daily.  Marland Kitchen atorvastatin (LIPITOR) 40 MG tablet Take 1 tablet (40 mg total) by mouth once daily 90 tablet 3  . cholecalciferol (CHOLECALCIFEROL) 1000 unit tablet Take 100 Units by mouth once daily  . cyanocobalamin (VITAMIN B12) 1000 MCG tablet Take 1,000 mcg by mouth once daily.  . diazePAM (VALIUM) 10 MG tablet 1 tablet before procedure 1 tablet 0  . glimepiride (AMARYL) 4 MG tablet Take 1 tablet (4 mg total) by mouth daily with breakfast 90 tablet 3  . hydrocortisone 2.5 % cream Apply topically 2 (two) times daily 30 g 5  . mecobalamin (B12 ACTIVE ORAL) Take by mouth  . omega 3-dha-epa-fish oil (FISH OIL) 100-160-1,000 mg Cap Take 1 capsule by mouth once daily   Allergies:  Marland Kitchen Mobic [Meloxicam] Other (See Comments)  Reflux after a few days   Past Medical History:  . CAD (coronary artery disease)  . GERD (gastroesophageal reflux disease)  . Hiatal hernia  . HTN (hypertension)  . Hyperlipidemia  . OSA on CPAP  . Osteoarthritis  . Psoriasis  . VHD (valvular heart disease)   Past Surgical History:  . cardiac cath  . COLONOSCOPY 06/21/2001  Adenomatous Polyp  . COLONOSCOPY 07/10/2003  PH Adenomatous Polyp  . COLONOSCOPY 12/01/2008  Adenomatous Polyps  . COLONOSCOPY 04/16/2012  PH Adenomatous Polyps: CBF 03/2017  . COLONOSCOPY 07/24/2017  PH Adenomatous Polyps:  CBF 07/2022  . CORONARY ARTERY BYPASS W/ARTERIAL GRAFTS OFF PUMP N/A 09/17/2012  Procedure: CORONARY ARTERY BYPASS W/ARTERIAL GRAFTS OFF PUMP; Surgeon: Lynnda Shields., MD; Location: [facility]; Service: Cardiothoracic; Laterality: N/A;  . FLEXIBLE SIGMOIDOSCOPY 06/08/2001 ([place])  . HARVEST LEFT INTERNAL MAMMARY ARTERY FOR CABG N/A 09/17/2012   Procedure: HARVEST LEFT INTERNAL MAMMARY ARTERY FOR CABG; Surgeon: Lynnda Shields., MD; Location: [facility]; Service: Cardiothoracic; Laterality: N/A;  . MINIMALLY INVASIVE DIRECT CORONARY ARTERY BYPASS W/SINGLE ARTERY GRAFT N/A 09/17/2012  Procedure: MINIMALLY INVASIVE DIRECT CORONARY ARTERY BYPASS W/SINGLE ARTERY GRAFT, MICS ; Surgeon: Lynnda Shields., MD; Location: [facility]; Service: Cardiothoracic; Laterality: N/A;  . TONSILLECTOMY 1998  . TRANSESOPHAGEAL ECHOCARDIOGRAPHY N/A 09/17/2012  Procedure: TRANSESOPHAGEAL ECHOCARDIOGRAPHY; Surgeon: Lynnda Shields., MD; Location: [facility]; Service: Cardiothoracic; Laterality: N/A;  . uvulectomy 1998   Family History:  . Coronary Artery Disease (Blocked arteries around heart) Father  . High blood pressure (Hypertension) Father  . Diabetes Father  . Heart failure Father  . Hyperlipidemia (Elevated cholesterol) Father  . Myocardial Infarction (Heart attack) Father  . Hodgkin's lymphoma Mother  . Leukemia Mother  . Hyperlipidemia (Elevated cholesterol) Daughter  . Diabetes Brother  . Diverticulitis Brother  . Hyperlipidemia (Elevated cholesterol) Brother  . High blood pressure (Hypertension) Brother  . Irritable bowel syndrome Brother   Social History:   Socioeconomic History:  Marland Kitchen Marital status: Married  Spouse name: Not on file  . Number of children: 2  . Years of education: Not on file  . Highest education level: Not on file  Occupational History  . Occupation: Engineer, production  Comment: retired  . Occupation: Chemical engineer  Comment: part time at [employer]  Tobacco Use  . Smoking status: Former Smoker  Types: Cigars  Start date: 09/22/1989  Quit date: 09/07/2012  Years since quitting: 7.9  . Smokeless tobacco: Former Systems developer  Quit date: 09/07/2012  . Tobacco comment: 2 cigars per day for 25 years  Substance and Sexual Activity  . Alcohol use: Yes  . Drug use: Never  . Sexual  activity: Not Currently  Partners: Female  Other Topics Concern  . Not on file  Social History Narrative  . Not on file   Social Determinants of Health:   Financial Resource Strain: Not on file  Food Insecurity: Not on file  Transportation Needs: Not on file   Review of Systems:  A comprehensive 14 point ROS was performed, reviewed, and the pertinent orthopaedic findings are documented in the HPI.  Physical Exam:  Vitals:  08/03/20 1133  BP: 132/82  Weight: 95 kg (209 lb 6.4 oz)  Height: 172.7 cm (5\' 8" )  PainSc: 1  PainLoc: Shoulder   General/Constitutional: The patient appears to be well-nourished, well-developed, and in no acute distress. Neuro/Psych: Normal mood and affect, oriented to person, place and time. Eyes: Non-icteric. Pupils are equal, round, and reactive to light, and exhibit synchronous movement. ENT: Unremarkable. Lymphatic: No palpable adenopathy. Respiratory: Lungs clear to auscultation, Normal chest excursion, No wheezes and Non-labored breathing Cardiovascular: Regular rate and rhythm. No murmurs. and No edema, swelling or tenderness, except as noted in detailed exam. Integumentary: No impressive skin lesions present, except as noted in detailed exam. Musculoskeletal: Unremarkable, except as noted in detailed exam.  Right shoulder exam: SKIN: normal SWELLING: none WARMTH: none LYMPH NODES: no adenopathy palpable CREPITUS: none TENDERNESS: Mildly tender over anterolateral shoulder ROM (active):  Forward flexion: 100 degrees Abduction: 95 degrees Internal rotation:  L4 ROM (passive):  Forward flexion: 145 degrees Abduction: 135 degree ER/IR at 90 abd: 90 degrees / 45 degrees  He has moderate to severe pain at the extremes of forward flexion, abduction, internal rotation, and internal rotation at 90 degrees of abduction  STRENGTH: Forward flexion: 3/5 Abduction: 3/5 External rotation: 4/5 Internal rotation: 4+/5 Pain with RC testing: Moderate  pain with resisted forward flexion and abduction  STABILITY: Normal  SPECIAL TESTS: Luan Pulling' test: positive, moderate Speed's test: positive Capsulitis - pain w/ passive ER: no Crossed arm test: Mild-moderately positive Crank: Not evaluated Anterior apprehension: Negative Posterior apprehension: Not evaluated  He is neurovascularly intact to the right upper extremity.  Imaging:  Shoulder X-Ray Imaging: Recent AP, Y-scapular, and axillary views of the right shoulder are available for review. These films demonstrate no evidence for fractures, lytic lesions, or significant degenerative changes. The subacromial space is mildly decreased. There is no subacromial or infra-clavicular spurring. He demonstrates a Type II acromion.  Shoulder Imaging, MRI: Right Shoulder: MRI Shoulder Cartilage: No cartilage abnormality. MRI Shoulder Rotator Cuff: Full thickness tear of the supraspinatus. Retracted to the humeral head. MRI Shoulder Labrum / Biceps: No labral tear or biceps abormality. MRI Shoulder Bone: Normal bone.  Both the films and report reviewed by myself and discussed with the patient and his wife.  Assessment:  1. Traumatic complete tear of right rotator cuff.  2. Rotator cuff tendinitis, right.   Plan:  The treatment options were discussed with the patient and his wife. In addition, patient educational materials were provided regarding the diagnosis and treatment options. The patient is quite frustrated by his symptoms and function limitations, and is ready to consider more aggressive treatment options. Therefore, I have recommended a surgical procedure, specifically a right shoulder arthroscopy with debridement, decompression, rotator cuff repair, and probable biceps tenodesis. The procedure was discussed with the patient, as were the potential risks (including bleeding, infection, nerve and/or blood vessel injury, persistent or recurrent pain, failure of the repair, progression of  arthritis, need for further surgery, blood clots, strokes, heart attacks and/or arhythmias, pneumonia, etc.) and benefits. The patient states his understanding and wishes to proceed. All of the patient's questions and concerns were answered. He can call any time with further concerns. He will follow up post-surgery, routine.    H&P reviewed and patient re-examined. No changes.

## 2020-08-26 NOTE — Anesthesia Preprocedure Evaluation (Signed)
Anesthesia Evaluation  Patient identified by MRN, date of birth, ID band Patient awake    Reviewed: Allergy & Precautions, NPO status , Patient's Chart, lab work & pertinent test results  History of Anesthesia Complications Negative for: history of anesthetic complications  Airway Mallampati: III       Dental   Pulmonary sleep apnea and Continuous Positive Airway Pressure Ventilation , neg COPD, Not current smoker, former smoker,           Cardiovascular hypertension, Pt. on medications + CAD and + CABG  (-) CHF (-) dysrhythmias (-) Valvular Problems/Murmurs     Neuro/Psych neg Seizures    GI/Hepatic Neg liver ROS, hiatal hernia,   Endo/Other  diabetes, Type 2, Oral Hypoglycemic Agents  Renal/GU Renal InsufficiencyRenal disease     Musculoskeletal   Abdominal   Peds  Hematology   Anesthesia Other Findings   Reproductive/Obstetrics                             Anesthesia Physical Anesthesia Plan  ASA: III  Anesthesia Plan: General   Post-op Pain Management:  Regional for Post-op pain   Induction: Intravenous  PONV Risk Score and Plan: 2 and Dexamethasone and Ondansetron  Airway Management Planned: Oral ETT  Additional Equipment:   Intra-op Plan:   Post-operative Plan:   Informed Consent: I have reviewed the patients History and Physical, chart, labs and discussed the procedure including the risks, benefits and alternatives for the proposed anesthesia with the patient or authorized representative who has indicated his/her understanding and acceptance.       Plan Discussed with:   Anesthesia Plan Comments:         Anesthesia Quick Evaluation

## 2020-08-26 NOTE — Anesthesia Procedure Notes (Signed)
Anesthesia Regional Block: Interscalene brachial plexus block   Pre-Anesthetic Checklist: ,, timeout performed, Correct Patient, Correct Site, Correct Laterality, Correct Procedure, Correct Position, site marked, Risks and benefits discussed,  Surgical consent,  Pre-op evaluation,  At surgeon's request and post-op pain management  Laterality: Right  Prep: chloraprep       Needles:  Injection technique: Single-shot  Needle Type: Echogenic Stimulator Needle     Needle Length: 10cm  Needle Gauge: 22     Additional Needles:   Procedures:,,,, ultrasound used (permanent image in chart),,,,   Nerve Stimulator or Paresthesia:  Response: 0.8 mA,   Additional Responses:   Narrative:  Start time: 08/26/2020 7:19 AM End time: 08/26/2020 7:22 AM  Performed by: Personally  Anesthesiologist: Molli Barrows, MD  Additional Notes: Pt. Identified and accepting of procedure after risks and benefits fully reviewed and questions answered. Time out performed and laterality confirmed prior to procedure.  ISNB  performed without difficulty and well tolerated.  Neg IV and SATD.  No pain on injection of Local anesthetic and VSST.

## 2020-08-26 NOTE — Anesthesia Procedure Notes (Signed)
Procedure Name: Intubation Date/Time: 08/26/2020 8:12 AM Performed by: Gaynelle Cage, CRNA Pre-anesthesia Checklist: Patient identified, Emergency Drugs available, Suction available and Patient being monitored Patient Re-evaluated:Patient Re-evaluated prior to induction Oxygen Delivery Method: Circle system utilized Preoxygenation: Pre-oxygenation with 100% oxygen Induction Type: IV induction Ventilation: Mask ventilation with difficulty, Oral airway inserted - appropriate to patient size and Two handed mask ventilation required Laryngoscope Size: Glidescope, McGraph, 2 and 4 Grade View: Grade III Tube type: Oral Number of attempts: 4 Airway Equipment and Method: Stylet,  Oral airway,  Bougie stylet,  Video-laryngoscopy and Fiberoptic brochoscope Dental Injury: Bloody posterior oropharynx  Difficulty Due To: Difficult Airway- due to immobile epiglottis, Difficult Airway-  due to edematous airway and Difficulty was unanticipated Future Recommendations: Recommend- awake intubation Comments: DIFFICULT AIRWAY. Despite using MCgrath and Glidescope and Fiberoptic blades, driveable bougie, could not visualize cords, no view better than Grade 3. Patient did not desaturate at any point, able to be ventilated with airway and two handed mask. Copious secretions and some blood from prior atttempts. Decision made to wake patient up. Sugammadex administered. Will plan to reschedule procedure and plan for controlled elective awake fiberoptic intubation.

## 2020-09-07 ENCOUNTER — Other Ambulatory Visit
Admission: RE | Admit: 2020-09-07 | Discharge: 2020-09-07 | Disposition: A | Discharge status: Home / Self Care | Payer: Medicare Other | Source: Ambulatory Visit | Attending: Surgery | Admitting: Surgery

## 2020-09-07 DIAGNOSIS — U071 COVID-19: Secondary | ICD-10-CM | POA: Insufficient documentation

## 2020-09-07 DIAGNOSIS — Z01812 Encounter for preprocedural laboratory examination: Secondary | ICD-10-CM | POA: Diagnosis present

## 2020-09-07 LAB — SARS CORONAVIRUS 2 (TAT 6-24 HRS): SARS Coronavirus 2: POSITIVE — AB

## 2020-09-08 ENCOUNTER — Other Ambulatory Visit
Admission: RE | Admit: 2020-09-08 | Discharge: 2020-09-08 | Disposition: A | Discharge status: Home / Self Care | Payer: Medicare Other | Source: Ambulatory Visit | Attending: Surgery | Admitting: Surgery

## 2020-09-08 ENCOUNTER — Telehealth: Payer: Self-pay | Admitting: Nurse Practitioner

## 2020-09-08 ENCOUNTER — Other Ambulatory Visit: Payer: Self-pay | Admitting: Surgery

## 2020-09-08 ENCOUNTER — Other Ambulatory Visit: Payer: Self-pay

## 2020-09-08 DIAGNOSIS — Z01812 Encounter for preprocedural laboratory examination: Secondary | ICD-10-CM | POA: Insufficient documentation

## 2020-09-08 DIAGNOSIS — Z20822 Contact with and (suspected) exposure to covid-19: Secondary | ICD-10-CM | POA: Insufficient documentation

## 2020-09-08 LAB — SARS CORONAVIRUS 2 BY RT PCR (HOSPITAL ORDER, PERFORMED IN ~~LOC~~ HOSPITAL LAB): SARS Coronavirus 2: NEGATIVE

## 2020-09-08 NOTE — Telephone Encounter (Signed)
I called Andrew Marquez to discuss Covid symptoms and the use of Sotrovimab, a monoclonal antibody infusion for those with mild to moderate Covid symptoms and at a high risk of hospitalization.     Pt does not qualify for infusion therapy as pt is asymptomatic and repeat testing today (all recent testing done in light of pending shoulder surgery) is negative. Pt encouraged to obtain booster dose of COVID19 vaccine.  Murray Hodgkins, NP

## 2020-09-09 ENCOUNTER — Encounter
Admission: RE | Disposition: A | Discharge status: Home / Self Care | Payer: Self-pay | Source: Ambulatory Visit | Attending: Surgery

## 2020-09-09 ENCOUNTER — Ambulatory Visit: Payer: Medicare Other | Admitting: Registered Nurse

## 2020-09-09 ENCOUNTER — Ambulatory Visit: Payer: Medicare Other

## 2020-09-09 ENCOUNTER — Ambulatory Visit
Admission: RE | Admit: 2020-09-09 | Discharge: 2020-09-09 | Disposition: A | Discharge status: Home / Self Care | Payer: Medicare Other | Source: Ambulatory Visit | Attending: Surgery | Admitting: Surgery

## 2020-09-09 ENCOUNTER — Encounter: Payer: Self-pay | Admitting: Surgery

## 2020-09-09 DIAGNOSIS — Z833 Family history of diabetes mellitus: Secondary | ICD-10-CM | POA: Diagnosis not present

## 2020-09-09 DIAGNOSIS — I10 Essential (primary) hypertension: Secondary | ICD-10-CM | POA: Insufficient documentation

## 2020-09-09 DIAGNOSIS — M75121 Complete rotator cuff tear or rupture of right shoulder, not specified as traumatic: Secondary | ICD-10-CM | POA: Insufficient documentation

## 2020-09-09 DIAGNOSIS — Z7984 Long term (current) use of oral hypoglycemic drugs: Secondary | ICD-10-CM | POA: Diagnosis not present

## 2020-09-09 DIAGNOSIS — Z951 Presence of aortocoronary bypass graft: Secondary | ICD-10-CM | POA: Diagnosis not present

## 2020-09-09 DIAGNOSIS — Z806 Family history of leukemia: Secondary | ICD-10-CM | POA: Diagnosis not present

## 2020-09-09 DIAGNOSIS — M1990 Unspecified osteoarthritis, unspecified site: Secondary | ICD-10-CM | POA: Diagnosis not present

## 2020-09-09 DIAGNOSIS — Z87891 Personal history of nicotine dependence: Secondary | ICD-10-CM | POA: Diagnosis not present

## 2020-09-09 DIAGNOSIS — E119 Type 2 diabetes mellitus without complications: Secondary | ICD-10-CM | POA: Diagnosis not present

## 2020-09-09 DIAGNOSIS — M7581 Other shoulder lesions, right shoulder: Secondary | ICD-10-CM | POA: Insufficient documentation

## 2020-09-09 DIAGNOSIS — Z807 Family history of other malignant neoplasms of lymphoid, hematopoietic and related tissues: Secondary | ICD-10-CM | POA: Diagnosis not present

## 2020-09-09 DIAGNOSIS — M75101 Unspecified rotator cuff tear or rupture of right shoulder, not specified as traumatic: Secondary | ICD-10-CM | POA: Diagnosis present

## 2020-09-09 DIAGNOSIS — Z7982 Long term (current) use of aspirin: Secondary | ICD-10-CM | POA: Diagnosis not present

## 2020-09-09 DIAGNOSIS — Z888 Allergy status to other drugs, medicaments and biological substances status: Secondary | ICD-10-CM | POA: Diagnosis not present

## 2020-09-09 DIAGNOSIS — Z79899 Other long term (current) drug therapy: Secondary | ICD-10-CM | POA: Diagnosis not present

## 2020-09-09 DIAGNOSIS — I2510 Atherosclerotic heart disease of native coronary artery without angina pectoris: Secondary | ICD-10-CM | POA: Insufficient documentation

## 2020-09-09 DIAGNOSIS — Z419 Encounter for procedure for purposes other than remedying health state, unspecified: Secondary | ICD-10-CM

## 2020-09-09 DIAGNOSIS — G4730 Sleep apnea, unspecified: Secondary | ICD-10-CM | POA: Insufficient documentation

## 2020-09-09 DIAGNOSIS — Z8349 Family history of other endocrine, nutritional and metabolic diseases: Secondary | ICD-10-CM | POA: Insufficient documentation

## 2020-09-09 DIAGNOSIS — Z8249 Family history of ischemic heart disease and other diseases of the circulatory system: Secondary | ICD-10-CM | POA: Diagnosis not present

## 2020-09-09 DIAGNOSIS — Z8379 Family history of other diseases of the digestive system: Secondary | ICD-10-CM | POA: Diagnosis not present

## 2020-09-09 HISTORY — PX: SHOULDER ARTHROSCOPY WITH SUBACROMIAL DECOMPRESSION, ROTATOR CUFF REPAIR AND BICEP TENDON REPAIR: SHX5687

## 2020-09-09 LAB — GLUCOSE, CAPILLARY
Glucose-Capillary: 122 mg/dL — ABNORMAL HIGH (ref 70–99)
Glucose-Capillary: 150 mg/dL — ABNORMAL HIGH (ref 70–99)

## 2020-09-09 SURGERY — SHOULDER ARTHROSCOPY WITH SUBACROMIAL DECOMPRESSION, ROTATOR CUFF REPAIR AND BICEP TENDON REPAIR
Anesthesia: General | Site: Shoulder | Laterality: Right

## 2020-09-09 MED ORDER — FENTANYL CITRATE (PF) 100 MCG/2ML IJ SOLN
25.0000 ug | INTRAMUSCULAR | Status: DC | PRN
  Administered 2020-09-09 (×3): 50 ug via INTRAVENOUS

## 2020-09-09 MED ORDER — ONDANSETRON HCL 4 MG/2ML IJ SOLN
4.0000 mg | Freq: Once | INTRAMUSCULAR | Status: AC | PRN
  Administered 2020-09-09: 14:00:00 4 mg via INTRAVENOUS

## 2020-09-09 MED ORDER — EPINEPHRINE PF 1 MG/ML IJ SOLN
INTRAMUSCULAR | Status: AC
  Filled 2020-09-09: qty 1

## 2020-09-09 MED ORDER — DEXAMETHASONE SODIUM PHOSPHATE 10 MG/ML IJ SOLN
INTRAMUSCULAR | Status: DC | PRN
  Administered 2020-09-09: 10 mg via INTRAVENOUS

## 2020-09-09 MED ORDER — ONDANSETRON HCL 4 MG/2ML IJ SOLN
INTRAMUSCULAR | Status: AC
  Filled 2020-09-09: qty 2

## 2020-09-09 MED ORDER — PHENYLEPHRINE HCL (PRESSORS) 10 MG/ML IV SOLN
INTRAVENOUS | Status: AC
  Filled 2020-09-09: qty 1

## 2020-09-09 MED ORDER — ROPIVACAINE HCL 5 MG/ML IJ SOLN
INTRAMUSCULAR | Status: AC
  Filled 2020-09-09: qty 30

## 2020-09-09 MED ORDER — BUTAMBEN-TETRACAINE-BENZOCAINE 2-2-14 % EX AERO
1.0000 | INHALATION_SPRAY | Freq: Once | CUTANEOUS | Status: AC
  Administered 2020-09-09: 11:00:00 2 via TOPICAL
  Administered 2020-09-09: 11:00:00 4 via TOPICAL
  Filled 2020-09-09: qty 20

## 2020-09-09 MED ORDER — MIDAZOLAM HCL 2 MG/2ML IJ SOLN
INTRAMUSCULAR | Status: DC | PRN
  Administered 2020-09-09: 1 mg via INTRAVENOUS
  Administered 2020-09-09: 2 mg via INTRAVENOUS
  Administered 2020-09-09: 1 mg via INTRAVENOUS

## 2020-09-09 MED ORDER — BUPIVACAINE-EPINEPHRINE 0.5% -1:200000 IJ SOLN
INTRAMUSCULAR | Status: DC | PRN
  Administered 2020-09-09: 30 mL

## 2020-09-09 MED ORDER — SODIUM CHLORIDE 0.9 % IV SOLN
INTRAVENOUS | Status: DC | PRN
  Administered 2020-09-09: 11:00:00 25 ug/min via INTRAVENOUS

## 2020-09-09 MED ORDER — DEXMEDETOMIDINE (PRECEDEX) IN NS 20 MCG/5ML (4 MCG/ML) IV SYRINGE
PREFILLED_SYRINGE | INTRAVENOUS | Status: AC
  Filled 2020-09-09: qty 5

## 2020-09-09 MED ORDER — MEPERIDINE HCL 50 MG/ML IJ SOLN: 6.2500 mg | INTRAMUSCULAR | Status: DC | PRN

## 2020-09-09 MED ORDER — PROPOFOL 10 MG/ML IV BOLUS
INTRAVENOUS | Status: AC
  Filled 2020-09-09: qty 40

## 2020-09-09 MED ORDER — SUCCINYLCHOLINE CHLORIDE 20 MG/ML IJ SOLN
INTRAMUSCULAR | Status: DC | PRN
  Administered 2020-09-09: 160 mg via INTRAVENOUS

## 2020-09-09 MED ORDER — CHLORHEXIDINE GLUCONATE 0.12 % MT SOLN: 15.0000 mL | Freq: Once | OROMUCOSAL | Status: AC

## 2020-09-09 MED ORDER — PROPOFOL 10 MG/ML IV BOLUS
INTRAVENOUS | Status: DC | PRN
  Administered 2020-09-09: 150 mg via INTRAVENOUS
  Administered 2020-09-09: 20 mg via INTRAVENOUS

## 2020-09-09 MED ORDER — PHENYLEPHRINE HCL (PRESSORS) 10 MG/ML IV SOLN
INTRAVENOUS | Status: DC | PRN
  Administered 2020-09-09: 200 ug via INTRAVENOUS

## 2020-09-09 MED ORDER — MIDAZOLAM HCL 2 MG/2ML IJ SOLN
INTRAMUSCULAR | Status: AC
  Filled 2020-09-09: qty 4

## 2020-09-09 MED ORDER — ACETAMINOPHEN 10 MG/ML IV SOLN
INTRAVENOUS | Status: AC
  Filled 2020-09-09: qty 100

## 2020-09-09 MED ORDER — ORAL CARE MOUTH RINSE: 15.0000 mL | Freq: Once | OROMUCOSAL | Status: AC

## 2020-09-09 MED ORDER — FENTANYL CITRATE (PF) 100 MCG/2ML IJ SOLN
INTRAMUSCULAR | Status: DC | PRN
  Administered 2020-09-09: 25 ug via INTRAVENOUS
  Administered 2020-09-09: 75 ug via INTRAVENOUS

## 2020-09-09 MED ORDER — OXYCODONE HCL 5 MG PO TABS: 5.0000 mg | ORAL_TABLET | ORAL | 0 refills | Status: AC | PRN

## 2020-09-09 MED ORDER — DEXMEDETOMIDINE HCL IN NACL 200 MCG/50ML IV SOLN
INTRAVENOUS | Status: DC | PRN
  Administered 2020-09-09: .6 ug/kg/h via INTRAVENOUS

## 2020-09-09 MED ORDER — DEXMEDETOMIDINE (PRECEDEX) IN NS 20 MCG/5ML (4 MCG/ML) IV SYRINGE
PREFILLED_SYRINGE | INTRAVENOUS | Status: DC | PRN
  Administered 2020-09-09: 8 ug via INTRAVENOUS
  Administered 2020-09-09: 4 ug via INTRAVENOUS
  Administered 2020-09-09: 8 ug via INTRAVENOUS
  Administered 2020-09-09: 20 ug via INTRAVENOUS

## 2020-09-09 MED ORDER — HYDROMORPHONE HCL 1 MG/ML IJ SOLN
INTRAMUSCULAR | Status: AC
  Administered 2020-09-09: 14:00:00 1 mg via INTRAVENOUS
  Filled 2020-09-09: qty 1

## 2020-09-09 MED ORDER — ONDANSETRON HCL 4 MG/2ML IJ SOLN
INTRAMUSCULAR | Status: DC | PRN
  Administered 2020-09-09: 4 mg via INTRAVENOUS

## 2020-09-09 MED ORDER — ACETAMINOPHEN 10 MG/ML IV SOLN
INTRAVENOUS | Status: DC | PRN
  Administered 2020-09-09: 1000 mg via INTRAVENOUS

## 2020-09-09 MED ORDER — LACTATED RINGERS IV SOLN
INTRAVENOUS | Status: DC | PRN
  Administered 2020-09-09: 11:00:00 1 mL

## 2020-09-09 MED ORDER — HYDROMORPHONE HCL 1 MG/ML IJ SOLN
INTRAMUSCULAR | Status: AC
  Filled 2020-09-09: qty 1

## 2020-09-09 MED ORDER — PROMETHAZINE HCL 25 MG/ML IJ SOLN
INTRAMUSCULAR | Status: AC
  Filled 2020-09-09: qty 1

## 2020-09-09 MED ORDER — FENTANYL CITRATE (PF) 100 MCG/2ML IJ SOLN
INTRAMUSCULAR | Status: AC
  Filled 2020-09-09: qty 2

## 2020-09-09 MED ORDER — CEFAZOLIN SODIUM-DEXTROSE 2-4 GM/100ML-% IV SOLN
2.0000 g | INTRAVENOUS | Status: AC
  Administered 2020-09-09: 11:00:00 2 g via INTRAVENOUS

## 2020-09-09 MED ORDER — HYDROMORPHONE HCL 1 MG/ML IJ SOLN: 0.5000 mg | INTRAMUSCULAR | Status: DC | PRN

## 2020-09-09 MED ORDER — CHLORHEXIDINE GLUCONATE 0.12 % MT SOLN
OROMUCOSAL | Status: AC
  Administered 2020-09-09: 09:00:00 15 mL via OROMUCOSAL
  Filled 2020-09-09: qty 15

## 2020-09-09 MED ORDER — FAMOTIDINE 20 MG PO TABS
ORAL_TABLET | ORAL | Status: AC
  Administered 2020-09-09: 09:00:00 20 mg via ORAL
  Filled 2020-09-09: qty 1

## 2020-09-09 MED ORDER — DEXAMETHASONE SODIUM PHOSPHATE 10 MG/ML IJ SOLN
INTRAMUSCULAR | Status: AC
  Filled 2020-09-09: qty 1

## 2020-09-09 MED ORDER — LIDOCAINE HCL (PF) 4 % IJ SOLN
5.0000 mL | Freq: Once | INTRAMUSCULAR | Status: AC
  Administered 2020-09-09: 09:00:00 5 mL

## 2020-09-09 MED ORDER — SUGAMMADEX SODIUM 200 MG/2ML IV SOLN
INTRAVENOUS | Status: DC | PRN
  Administered 2020-09-09: 200 mg via INTRAVENOUS

## 2020-09-09 MED ORDER — ROCURONIUM BROMIDE 100 MG/10ML IV SOLN
INTRAVENOUS | Status: DC | PRN
  Administered 2020-09-09: 50 mg via INTRAVENOUS

## 2020-09-09 MED ORDER — PROMETHAZINE HCL 25 MG/ML IJ SOLN
6.2500 mg | INTRAMUSCULAR | Status: DC | PRN
Start: 2020-09-09 — End: 2020-09-09
  Administered 2020-09-09: 15:00:00 6.25 mg via INTRAVENOUS

## 2020-09-09 MED ORDER — GLYCOPYRROLATE 0.2 MG/ML IJ SOLN
INTRAMUSCULAR | Status: DC | PRN
  Administered 2020-09-09: .2 mg via INTRAVENOUS

## 2020-09-09 MED ORDER — CEFAZOLIN SODIUM-DEXTROSE 2-4 GM/100ML-% IV SOLN
INTRAVENOUS | Status: AC
  Filled 2020-09-09: qty 100

## 2020-09-09 MED ORDER — SODIUM CHLORIDE 0.9 % IV SOLN: INTRAVENOUS | Status: DC

## 2020-09-09 MED ORDER — LIDOCAINE HCL (PF) 2 % IJ SOLN
INTRAMUSCULAR | Status: AC
  Filled 2020-09-09: qty 5

## 2020-09-09 MED ORDER — HYDROMORPHONE HCL 1 MG/ML IJ SOLN
1.0000 mg | INTRAMUSCULAR | Status: AC | PRN
  Administered 2020-09-09: 1 mg via INTRAVENOUS

## 2020-09-09 MED ORDER — BUPIVACAINE-EPINEPHRINE (PF) 0.5% -1:200000 IJ SOLN
INTRAMUSCULAR | Status: AC
  Filled 2020-09-09: qty 30

## 2020-09-09 MED ORDER — LIDOCAINE HCL (CARDIAC) PF 100 MG/5ML IV SOSY
PREFILLED_SYRINGE | INTRAVENOUS | Status: DC | PRN
  Administered 2020-09-09: 40 mg via INTRAVENOUS

## 2020-09-09 MED ORDER — FAMOTIDINE 20 MG PO TABS: 20.0000 mg | ORAL_TABLET | Freq: Once | ORAL | Status: AC

## 2020-09-09 MED ORDER — GLYCOPYRROLATE 0.2 MG/ML IJ SOLN
INTRAMUSCULAR | Status: AC
  Filled 2020-09-09: qty 1

## 2020-09-09 SURGICAL SUPPLY — 54 items
ANCH SUT 2 2.9 2 LD TPR NDL (Anchor) ×1 IMPLANT
ANCH SUT 5.5 KNTLS (Anchor) ×2 IMPLANT
ANCH SUT Q-FX 2.8 (Anchor) ×2 IMPLANT
ANCHOR ALL-SUT Q-FIX 2.8 (Anchor) ×6 IMPLANT
ANCHOR HEALICOIL REGEN 5.5 (Anchor) ×6 IMPLANT
ANCHOR JUGGERKNOT WTAP NDL 2.9 (Anchor) ×3 IMPLANT
APL PRP STRL LF DISP 70% ISPRP (MISCELLANEOUS) ×2
BIT DRILL JUGRKNT W/NDL BIT2.9 (DRILL) ×1 IMPLANT
BLADE FULL RADIUS 3.5 (BLADE) ×3 IMPLANT
BUR ACROMIONIZER 4.0 (BURR) ×3 IMPLANT
CANNULA SHAVER 8MMX76MM (CANNULA) ×3 IMPLANT
CHLORAPREP W/TINT 26 (MISCELLANEOUS) ×6 IMPLANT
COVER MAYO STAND REUSABLE (DRAPES) ×3 IMPLANT
COVER WAND RF STERILE (DRAPES) ×3 IMPLANT
DILATOR 5.5 THREADED HEALICOIL (MISCELLANEOUS) ×3 IMPLANT
DRAPE IMP U-DRAPE 54X76 (DRAPES) ×6 IMPLANT
DRILL JUGGERKNOT W/NDL BIT 2.9 (DRILL) ×3
ELECT CAUTERY BLADE TIP 2.5 (TIP) ×3
ELECT REM PT RETURN 9FT ADLT (ELECTROSURGICAL) ×3
ELECTRODE CAUTERY BLDE TIP 2.5 (TIP) ×1 IMPLANT
ELECTRODE REM PT RTRN 9FT ADLT (ELECTROSURGICAL) ×1 IMPLANT
GAUZE SPONGE 4X4 12PLY STRL (GAUZE/BANDAGES/DRESSINGS) ×3 IMPLANT
GAUZE XEROFORM 1X8 LF (GAUZE/BANDAGES/DRESSINGS) ×3 IMPLANT
GLOVE BIO SURGEON STRL SZ7.5 (GLOVE) ×6 IMPLANT
GLOVE BIO SURGEON STRL SZ8 (GLOVE) ×6 IMPLANT
GLOVE BIOGEL PI IND STRL 8 (GLOVE) ×1 IMPLANT
GLOVE BIOGEL PI INDICATOR 8 (GLOVE) ×2
GLOVE INDICATOR 8.0 STRL GRN (GLOVE) ×3 IMPLANT
GOWN STRL REUS W/ TWL LRG LVL3 (GOWN DISPOSABLE) ×1 IMPLANT
GOWN STRL REUS W/ TWL XL LVL3 (GOWN DISPOSABLE) ×1 IMPLANT
GOWN STRL REUS W/TWL LRG LVL3 (GOWN DISPOSABLE) ×3
GOWN STRL REUS W/TWL XL LVL3 (GOWN DISPOSABLE) ×3
GRASPER SUT 15 45D LOW PRO (SUTURE) IMPLANT
IV LACTATED RINGER IRRG 3000ML (IV SOLUTION) ×6
IV LR IRRIG 3000ML ARTHROMATIC (IV SOLUTION) ×2 IMPLANT
KIT CANNULA 8X76-LX IN CANNULA (CANNULA) IMPLANT
KIT SUTURE 2.8 Q-FIX DISP (MISCELLANEOUS) ×3 IMPLANT
MANIFOLD NEPTUNE II (INSTRUMENTS) ×3 IMPLANT
MASK FACE SPIDER DISP (MASK) ×3 IMPLANT
MAT ABSORB  FLUID 56X50 GRAY (MISCELLANEOUS) ×2
MAT ABSORB FLUID 56X50 GRAY (MISCELLANEOUS) ×1 IMPLANT
PACK ARTHROSCOPY SHOULDER (MISCELLANEOUS) ×3 IMPLANT
PASSER SUT FIRSTPASS SELF (INSTRUMENTS) ×3 IMPLANT
SLING ARM LRG DEEP (SOFTGOODS) IMPLANT
SLING ULTRA II LG (MISCELLANEOUS) IMPLANT
STAPLER SKIN PROX 35W (STAPLE) ×3 IMPLANT
STRAP SAFETY 5IN WIDE (MISCELLANEOUS) ×3 IMPLANT
SUT ETHIBOND 0 MO6 C/R (SUTURE) ×3 IMPLANT
SUT ULTRABRAID 2 COBRAID 38 (SUTURE) ×3 IMPLANT
SUT VIC AB 2-0 CT1 27 (SUTURE) ×6
SUT VIC AB 2-0 CT1 TAPERPNT 27 (SUTURE) ×2 IMPLANT
TAPE MICROFOAM 4IN (TAPE) ×3 IMPLANT
TUBING ARTHRO INFLOW-ONLY STRL (TUBING) ×3 IMPLANT
WAND WEREWOLF FLOW 90D (MISCELLANEOUS) ×3 IMPLANT

## 2020-09-09 NOTE — Progress Notes (Signed)
Patients sats drop into mid to upper 80s when sleeping. Rise when pt wakened and takes deep breaths.  Has been using incentive spirometer.  pt uses cpap at home at night and during day when napping. Dr. Nira Retort aware and ok for discharge/move to post op.

## 2020-09-09 NOTE — Op Note (Signed)
09/09/2020  12:52 PM  Patient:   Andrew Marquez  Pre-Op Diagnosis:   Traumatic full-thickness rotator cuff tear, right shoulder  Post-Op Diagnosis:   Traumatic full-thickness rotator cuff tear, labral fraying, and biceps tendinopathy, right shoulder.  Procedure:   Limited arthroscopic debridement, arthroscopic subacromial decompression, mini-open rotator cuff repair, and mini-open biceps tenodesis, right shoulder.  Anesthesia:   General endotracheal with interscalene block using Exparel placed post-operatively by the anesthesiologist.  Surgeon:   Pascal Lux, MD  Assistant:   Arvilla Meres, RNFA  Findings:   As above. There was a full-thickness tear involving the entire supraspinatus insertion as well as the anterior half of the infraspinatus tendon insertion. The remainder the rotator cuff was in satisfactory condition. There was labral fraying anteriorly, superiorly, and postero-superiorly without frank detachment from the glenoid rim. The biceps tendon demonstrated areas of "lip sticking" without partial or full-thickness tearing. The articular surfaces of the glenoid and humerus both were in satisfactory condition.  Complications:   None  Fluids:   700 cc  Estimated blood loss:   10 cc  Tourniquet time:   None  Drains:   None  Closure:   Staples      Brief clinical note:   The patient is a 69 year old male who sustained the above-noted injury several months ago well trying to maneuver a toilet in a small space in his home. The patient's symptoms have progressed despite medications, activity modification, etc. The patient's history and examination are consistent with impingement/tendinopathy with a traumatic rotator cuff tear. These findings were confirmed by MRI scan. The patient presents at this time for definitive management of these shoulder symptoms.  Procedure:   The patient was brought into the operating room and lain in the supine position. Given the patient's prior  problems with anesthesia several weeks ago, the patient underwent "awake" general nasotracheal intubation and anesthesia before being repositioned in the beach chair position using the beach chair positioner. The right shoulder and upper extremity were prepped with ChloraPrep solution before being draped sterilely. Preoperative antibiotics were administered. A timeout was performed to confirm the proper surgical site before the expected portal sites and incision site were injected with 0.5% Sensorcaine with epinephrine. A posterior portal was created and the glenohumeral joint thoroughly inspected with the findings as described above. An anterior portal was created using an outside-in technique. The labrum and rotator cuff were further probed, again confirming the above-noted findings. The areas of labral fraying were debrided back to stable margins using the full-radius resector, as were areas of synovitis. The torn margin of the rotator cuff also was debrided using the full-radius resector. The ArthroCare wand was inserted and used to release the biceps tendon from its labral anchor. It also was used to obtain hemostasis as well as to "anneal" the labrum superiorly and anteriorly. The instruments were removed from the joint after suctioning the excess fluid.  The camera was repositioned through the posterior portal into the subacromial space. A separate lateral portal was created using an outside-in technique. The 3.5 mm full-radius resector was introduced and used to perform a subtotal bursectomy. The ArthroCare wand was then inserted and used to remove the periosteal tissue off the undersurface of the anterior third of the acromion as well as to recess the coracoacromial ligament from its attachment along the anterior and lateral margins of the acromion. The 4.0 mm acromionizing bur was introduced and used to complete the decompression by removing the undersurface of the anterior third of  the acromion. The  full radius resector was reintroduced to remove any residual bony debris before the ArthroCare wand was reintroduced to obtain hemostasis. The instruments were then removed from the subacromial space after suctioning the excess fluid.  An approximately 4-5 cm incision was made over the anterolateral aspect of the shoulder beginning at the anterolateral corner of the acromion and extending distally in line with the bicipital groove. This incision was carried down through the subcutaneous tissues to expose the deltoid fascia. The raphae between the anterior and middle thirds was identified and this plane developed to provide access into the subacromial space. Additional bursal tissues were debrided sharply using Metzenbaum scissors. The rotator cuff tear was readily identified. The margins were debrided sharply with a #15 blade and the exposed greater tuberosity roughened with a rongeur. The longitudinal portion of the tear was repaired using a #2 FiberWire placed in a side-to-side fashion while the longer horizontal portion of the tear was repaired using two Smith [employer] Nephew 2.8 mm Q-Fix anchors. These sutures were then brought back laterally and secured using two Endeavor knotless RegeneSorb anchors to create a two-layer closure. An apparent watertight closure was obtained.  The bicipital groove was identified by palpation and opened for 1-1.5 cm. The biceps tendon stump was retrieved through this defect. The floor of the bicipital groove was roughened with a curet before another Biomet 2.9 mm JuggerKnot anchor was inserted. Both sets of sutures were passed through the biceps tendon and tied securely to effect the tenodesis. The bicipital sheath was reapproximated using two #0 Ethibond interrupted sutures, incorporating the biceps tendon to further reinforce the tenodesis.  The wound was copiously irrigated with sterile saline solution before the deltoid raphae was reapproximated using 2-0 Vicryl  interrupted sutures. The subcutaneous tissues were closed in two layers using 2-0 Vicryl interrupted sutures before the skin was closed using staples. The portal sites also were closed using staples. A sterile bulky dressing was applied to the shoulder before the arm was placed into a shoulder immobilizer. The patient was then awakened, extubated, and returned to the recovery room in satisfactory condition after tolerating the procedure well.  In the recovery room, the anesthesiologist performed an interscalene block using Exparel to help with post-operative pain relief.

## 2020-09-09 NOTE — Anesthesia Procedure Notes (Addendum)
Procedure Name: Awake intubation Date/Time: 09/09/2020 10:46 AM Performed by: Lowry Bowl, CRNA Pre-anesthesia Checklist: Patient identified, Emergency Drugs available, Suction available and Patient being monitored Patient Re-evaluated:Patient Re-evaluated prior to induction Oxygen Delivery Method: Circle system utilized Preoxygenation: Pre-oxygenation with 100% oxygen Induction Type: IV induction Ventilation: Mask ventilation without difficulty Laryngoscope Size: Mac and Glidescope Grade View: Grade IV Nasal Tubes: Nasal prep performed and Nasal Rae Tube size: 6.5 mm Number of attempts: 1 Airway Equipment and Method: Video-laryngoscopy,  Patient positioned with wedge pillow and Fiberoptic brochoscope Placement Confirmation: ETT inserted through vocal cords under direct vision,  positive ETCO2 and breath sounds checked- equal and bilateral Tube secured with: Tape Dental Injury: Teeth and Oropharynx as per pre-operative assessment  Difficulty Due To: Difficulty was anticipated and Difficult Airway- due to anterior larynx Future Recommendations: Recommend- awake intubation Comments: Awake intubation with pt spontaneously breathing; Tamala Julian CRNA attempted to view VC with glidescope S4 however unable to visualize VC and pt coughing; Ramsdell MD performed awake nasal intubation using fiberoptic scope through right nare.

## 2020-09-09 NOTE — Progress Notes (Signed)
   09/09/20 0835  Clinical Encounter Type  Visited With Family  Visit Type Initial  Referral From Chaplain  Consult/Referral To [place] visited with Pt's wife of 63 years. She said her husband was a little antsy this mother. Mrs. Gruenhagen said she was tired. She shared pictures of her grandson and talked about her children as well as her husband. Chaplain thanked her for being an example that marriage can last. Chaplain and Pt's wife agreed that marriage is work, but it can last if you work at it.

## 2020-09-09 NOTE — H&P (Signed)
History of Present Illness:  Andrew Marquez is a 69 y.o. male who presents today for a right shoulder arthroscopy with repair of his rotator cuff tear.   The patient's symptoms began in April, 2021. Apparently he was trying to set the toilet in a narrow space when he felt a pop in his shoulder. Initially he tried to ignore the symptoms, because of progressively worsening pain, he saw his primary care provider who referred him to orthopedics for further evaluation and treatment. He received a steroid injection by [name], PA-C, in April which he states provided substantial relief of his symptoms and lasted 4 to 5 months before his symptoms began to recur. He received a second steroid injection on 06/18/2020 which also provided moderate relief of his symptoms, but did not last so long. Therefore, the patient was sent for an MRI scan and referred to me for further evaluation and treatment. The patient describes the symptoms as marked (major pain with significant limitations) and have the quality of being aching, miserable, nagging, stabbing and throbbing. The pain is localized to the lateral arm/shoulder. These symptoms are aggravated constantly, with normal daily activities, with sleeping, at higher levels of activity, with overhead activity and reaching behind the back. He has tried acetaminophen with limited benefit. He has tried rest with no significant benefit. He has tried the one injection described above, but has not undergone any formal physical therapy for these symptoms. He denies any neck pain, noted she note any numbness or paresthesias down his arm to his hand. He denies any prior problems with the right shoulder. He is right-handed.  This complaint is not work related. He is a sports non-participant.  Shoulder Surgical History:  The patient has had no shoulder surgery in the past.  PMH/PSH/Family History/Social History/Meds/Allergies:  I have reviewed past medical, surgical, social and  family history, medications and allergies as documented in the EMR.  Current Outpatient Medications:  amLODIPine-benazepril (LOTREL) 5-40 mg capsule Take 1 capsule by mouth once daily 90 capsule 1   aspirin 81 MG EC tablet Take 81 mg by mouth daily.   atorvastatin (LIPITOR) 40 MG tablet Take 1 tablet (40 mg total) by mouth once daily 90 tablet 3   cholecalciferol (CHOLECALCIFEROL) 1000 unit tablet Take 100 Units by mouth once daily   cyanocobalamin (VITAMIN B12) 1000 MCG tablet Take 1,000 mcg by mouth once daily.   diazePAM (VALIUM) 10 MG tablet 1 tablet before procedure 1 tablet 0   glimepiride (AMARYL) 4 MG tablet Take 1 tablet (4 mg total) by mouth daily with breakfast 90 tablet 3   hydrocortisone 2.5 % cream Apply topically 2 (two) times daily 30 g 5   mecobalamin (B12 ACTIVE ORAL) Take by mouth   omega 3-dha-epa-fish oil (FISH OIL) 100-160-1,000 mg Cap Take 1 capsule by mouth once daily   Allergies:   Mobic [Meloxicam] Other (See Comments)  Reflux after a few days   Past Medical History:   CAD (coronary artery disease)   GERD (gastroesophageal reflux disease)   Hiatal hernia   HTN (hypertension)   Hyperlipidemia   OSA on CPAP   Osteoarthritis   Psoriasis   VHD (valvular heart disease)   Past Surgical History:   cardiac cath   COLONOSCOPY 06/21/2001  Adenomatous Polyp   COLONOSCOPY 07/10/2003  PH Adenomatous Polyp   COLONOSCOPY 12/01/2008  Adenomatous Polyps   COLONOSCOPY 04/16/2012  PH Adenomatous Polyps: CBF 03/2017   COLONOSCOPY 07/24/2017  PH Adenomatous Polyps: CBF 07/2022  CORONARY ARTERY BYPASS W/ARTERIAL GRAFTS OFF PUMP N/A 09/17/2012  Procedure: CORONARY ARTERY BYPASS W/ARTERIAL GRAFTS OFF PUMP; Surgeon: Lynnda Shields., MD; Location: [facility]; Service: Cardiothoracic; Laterality: N/A;   FLEXIBLE SIGMOIDOSCOPY 06/08/2001 ([place])   HARVEST LEFT INTERNAL MAMMARY ARTERY FOR CABG N/A 09/17/2012  Procedure: HARVEST  LEFT INTERNAL MAMMARY ARTERY FOR CABG; Surgeon: Lynnda Shields., MD; Location: [facility]; Service: Cardiothoracic; Laterality: N/A;   MINIMALLY INVASIVE DIRECT CORONARY ARTERY BYPASS W/SINGLE ARTERY GRAFT N/A 09/17/2012  Procedure: MINIMALLY INVASIVE DIRECT CORONARY ARTERY BYPASS W/SINGLE ARTERY GRAFT, MICS ; Surgeon: Lynnda Shields., MD; Location: [facility]; Service: Cardiothoracic; Laterality: N/A;   TONSILLECTOMY 1998   TRANSESOPHAGEAL ECHOCARDIOGRAPHY N/A 09/17/2012  Procedure: TRANSESOPHAGEAL ECHOCARDIOGRAPHY; Surgeon: Lynnda Shields., MD; Location: [facility]; Service: Cardiothoracic; Laterality: N/A;   uvulectomy 1998   Family History:   Coronary Artery Disease (Blocked arteries around heart) Father   High blood pressure (Hypertension) Father   Diabetes Father   Heart failure Father   Hyperlipidemia (Elevated cholesterol) Father   Myocardial Infarction (Heart attack) Father   Hodgkin's lymphoma Mother   Leukemia Mother   Hyperlipidemia (Elevated cholesterol) Daughter   Diabetes Brother   Diverticulitis Brother   Hyperlipidemia (Elevated cholesterol) Brother   High blood pressure (Hypertension) Brother   Irritable bowel syndrome Brother   Social History:   Socioeconomic History:   Marital status: Married  Spouse name: Not on file   Number of children: 2   Years of education: Not on file   Highest education level: Not on file  Occupational History   Occupation: Engineer, production  Comment: retired   Occupation: Chemical engineer  Comment: part time at [employer]  Tobacco Use   Smoking status: Former Smoker  Types: Cigars  Start date: 09/22/1989  Quit date: 09/07/2012  Years since quitting: 7.9   Smokeless tobacco: Former Systems developer  Quit date: 09/07/2012   Tobacco comment: 2 cigars per day for 25 years  Substance and Sexual Activity   Alcohol use: Yes   Drug use: Never   Sexual activity: Not Currently   Partners: Female  Other Topics Concern   Not on file  Social History Narrative   Not on file   Social Determinants of Health:   Emergency planning/management officer Strain: Not on file  Food Insecurity: Not on file  Transportation Needs: Not on file   Review of Systems:  A comprehensive 14 point ROS was performed, reviewed, and the pertinent orthopaedic findings are documented in the HPI.  Physical Exam:  Vitals:  08/03/20 1133  BP: 132/82  Weight: 95 kg (209 lb 6.4 oz)  Height: 172.7 cm (5\' 8" )  PainSc: 1  PainLoc: Shoulder   General/Constitutional: The patient appears to be well-nourished, well-developed, and in no acute distress. Neuro/Psych: Normal mood and affect, oriented to person, place and time. Eyes: Non-icteric. Pupils are equal, round, and reactive to light, and exhibit synchronous movement. ENT: Unremarkable. Lymphatic: No palpable adenopathy. Respiratory: Lungs clear to auscultation, Normal chest excursion, No wheezes and Non-labored breathing Cardiovascular: Regular rate and rhythm. No murmurs. and No edema, swelling or tenderness, except as noted in detailed exam. Integumentary: No impressive skin lesions present, except as noted in detailed exam. Musculoskeletal: Unremarkable, except as noted in detailed exam.  Right shoulder exam: SKIN: normal SWELLING: none WARMTH: none LYMPH NODES: no adenopathy palpable CREPITUS: none TENDERNESS: Mildly tender over anterolateral shoulder ROM (active):  Forward flexion: 100 degrees Abduction: 95 degrees Internal rotation: L4 ROM (passive):  Forward flexion: 145 degrees Abduction: 135 degree ER/IR at 90 abd: 90 degrees / 45 degrees  He has moderate to severe pain at the extremes of forward flexion, abduction, internal rotation, and internal rotation at 90 degrees of abduction  STRENGTH: Forward flexion: 3/5 Abduction: 3/5 External rotation: 4/5 Internal rotation: 4+/5 Pain with RC testing: Moderate pain with resisted  forward flexion and abduction  STABILITY: Normal  SPECIAL TESTS: Luan Pulling' test: positive, moderate Speed's test: positive Capsulitis - pain w/ passive ER: no Crossed arm test: Mild-moderately positive Crank: Not evaluated Anterior apprehension: Negative Posterior apprehension: Not evaluated  He is neurovascularly intact to the right upper extremity.  Imaging:  Shoulder X-Ray Imaging: Recent AP, Y-scapular, and axillary views of the right shoulder are available for review. These films demonstrate no evidence for fractures, lytic lesions, or significant degenerative changes. The subacromial space is mildly decreased. There is no subacromial or infra-clavicular spurring. He demonstrates a Type II acromion.  Shoulder Imaging, MRI: Right Shoulder: MRI Shoulder Cartilage: No cartilage abnormality. MRI Shoulder Rotator Cuff: Full thickness tear of the supraspinatus. Retracted to the humeral head. MRI Shoulder Labrum / Biceps: No labral tear or biceps abormality. MRI Shoulder Bone: Normal bone.  Both the films and report reviewed by myself and discussed with the patient and his wife.  Assessment:  1. Traumatic complete tear of right rotator cuff.  2. Rotator cuff tendinitis, right.   Plan:  The treatment options were discussed with the patient and his wife. In addition, patient educational materials were provided regarding the diagnosis and treatment options. The patient is quite frustrated by his symptoms and function limitations, and is ready to consider more aggressive treatment options. Therefore, I have recommended a surgical procedure, specifically a right shoulder arthroscopy with debridement, decompression, rotator cuff repair, and probable biceps tenodesis. The procedure was discussed with the patient, as were the potential risks (including bleeding, infection, nerve and/or blood vessel injury, persistent or recurrent pain, failure of the repair, progression of arthritis, need for  further surgery, blood clots, strokes, heart attacks and/or arhythmias, pneumonia, etc.) and benefits. The patient states his understanding and wishes to proceed. All of the patient's questions and concerns were answered. He can call any time with further concerns. He will follow up post-surgery, routine.  H&P reviewed and patient re-examined. No changes.

## 2020-09-09 NOTE — Anesthesia Preprocedure Evaluation (Addendum)
Anesthesia Evaluation  Patient identified by MRN, date of birth, ID band Patient awake    Reviewed: Allergy & Precautions, NPO status , Patient's Chart, lab work & pertinent test results  History of Anesthesia Complications Negative for: history of anesthetic complications  Airway Mallampati: IV  TM Distance: >3 FB Neck ROM: Full    Dental   Pulmonary sleep apnea and Continuous Positive Airway Pressure Ventilation , neg COPD, Not current smoker, former smoker,    Pulmonary exam normal        Cardiovascular hypertension, Pt. on medications + CAD and + CABG  (-) CHF Normal cardiovascular exam(-) dysrhythmias (-) Valvular Problems/Murmurs     Neuro/Psych neg Seizures    GI/Hepatic Neg liver ROS, hiatal hernia,   Endo/Other  diabetes, Well Controlled, Type 2, Oral Hypoglycemic Agents  Renal/GU Renal InsufficiencyRenal disease     Musculoskeletal   Abdominal   Peds  Hematology   Anesthesia Other Findings Unable to intubate for last GA.  Case aborted.  Returns today for planned awake fiberoptic intubation  Reproductive/Obstetrics                             Anesthesia Physical  Anesthesia Plan  ASA: III  Anesthesia Plan: General   Post-op Pain Management:  Regional for Post-op pain   Induction: Intravenous  PONV Risk Score and Plan: 2 and Dexamethasone and Ondansetron  Airway Management Planned: Oral ETT and Awake Intubation Planned  Additional Equipment:   Intra-op Plan:   Post-operative Plan:   Informed Consent: I have reviewed the patients History and Physical, chart, labs and discussed the procedure including the risks, benefits and alternatives for the proposed anesthesia with the patient or authorized representative who has indicated his/her understanding and acceptance.       Plan Discussed with:   Anesthesia Plan Comments: (ISB Planned for PACU)        Anesthesia  Quick Evaluation

## 2020-09-09 NOTE — Transfer of Care (Signed)
Immediate Anesthesia Transfer of Care Note  Patient: NYCHOLAS RAYNER  Procedure(s) Performed: SHOULDER ARTHROSCOPY WITH DEBRIDEMENT, DECOMPRESSION, ROTATOR CUFF REPAIR AND BICEP TENDON REPAIR (Right Shoulder)  Patient Location: PACU  Anesthesia Type:GA combined with regional for post-op pain  Level of Consciousness: awake, drowsy and patient cooperative  Airway & Oxygen Therapy: Patient Spontanous Breathing and Patient connected to face mask  Post-op Assessment: Report given to RN and Post -op Vital signs reviewed and stable  Post vital signs: Reviewed and stable  Last Vitals:  Vitals Value Taken Time  BP 162/89 09/09/20 1245  Temp 36.2 C 09/09/20 1245  Pulse 72 09/09/20 1249  Resp 18 09/09/20 1249  SpO2 98 % 09/09/20 1249  Vitals shown include unvalidated device data.  Last Pain:  Vitals:   09/09/20 0815  TempSrc: Temporal  PainSc: 2          Complications: No complications documented.

## 2020-09-09 NOTE — Anesthesia Postprocedure Evaluation (Signed)
Anesthesia Post Note  Patient: Andrew Marquez  Procedure(s) Performed: SHOULDER ARTHROSCOPY WITH DEBRIDEMENT, DECOMPRESSION, ROTATOR CUFF REPAIR AND BICEP TENDON REPAIR (Right Shoulder)  Patient location during evaluation: PACU Anesthesia Type: General and Regional Level of consciousness: awake and alert, awake and oriented Pain management: pain level controlled Vital Signs Assessment: post-procedure vital signs reviewed and stable Respiratory status: spontaneous breathing, nonlabored ventilation and respiratory function stable Cardiovascular status: blood pressure returned to baseline Postop Assessment: no apparent nausea or vomiting Anesthetic complications: no   No complications documented.   Last Vitals:  Vitals:   09/09/20 1509 09/09/20 1515  BP:  (!) 152/87  Pulse: 80 79  Resp: 14 20  Temp:    SpO2: 94% 93%    Last Pain:  Vitals:   09/09/20 1506  TempSrc:   PainSc: 2                  Phill Mutter

## 2020-09-09 NOTE — Progress Notes (Signed)
Dr Nira Retort at bedside for post op block.

## 2020-09-09 NOTE — Anesthesia Procedure Notes (Signed)
Anesthesia Regional Block: Interscalene brachial plexus block   Pre-Anesthetic Checklist: ,, timeout performed, Correct Patient, Correct Site, Correct Laterality, Correct Procedure, Correct Position, site marked, Risks and benefits discussed,  Surgical consent,  Pre-op evaluation,  At surgeon's request and post-op pain management  Laterality: Right  Prep: chloraprep       Needles:  Injection technique: Single-shot  Needle Type: Echogenic Stimulator Needle     Needle Length: 9cm  Needle Gauge: 21   Needle insertion depth: 2.5 cm   Additional Needles:   Procedures:, nerve stimulator,,, ultrasound used (permanent image in chart),,,,   Nerve Stimulator or Paresthesia:  Response: R Upper Extremity, 0.6 mA,   Additional Responses:   Narrative:  Start time: 09/09/2020 1:00 PM End time: 08/25/2020 1:54 PM  Performed by: Personally  Anesthesiologist: Phill Mutter, MD  Additional Notes: Technically difficult, poor landmarks.

## 2020-09-09 NOTE — Discharge Instructions (Addendum)
Orthopedic discharge instructions: Keep dressing dry and intact.  May shower after dressing changed on post-op day #4 (Sunday).  Cover staples with Band-Aids after drying off. Apply ice frequently to shoulder or use home ice machine. Take ibuprofen 600-800 mg TID with meals for 7-10 days, then as necessary. Take oxycodone as prescribed when needed.  May supplement with ES Tylenol if necessary. Keep shoulder immobilizer on at all times except may remove for bathing purposes. Follow-up in 10-14 days or as scheduled.   AMBULATORY SURGERY  DISCHARGE INSTRUCTIONS  1) The drugs that you were given will stay in your system until tomorrow so for the next 24 hours you should not: A) Drive an automobile B) Make any legal decisions C) Drink any alcoholic beverage  2) You may resume regular meals tomorrow.  Today it is better to start with liquids and gradually work up to solid foods. You may eat anything you prefer, but it is better to start with liquids, then soup and crackers, and gradually work up to solid foods.  3) Please notify your doctor immediately if you have any unusual bleeding, trouble breathing, redness and pain at the surgery site, drainage, fever, or pain not relieved by medication.   4) Additional Instructions:   Please contact your physician with any problems or Same Day Surgery at [phone], Monday through Friday 6 am to 4 pm, or [place] at [facility] number at [phone].

## 2020-09-10 ENCOUNTER — Encounter: Payer: Self-pay | Admitting: Surgery

## 2021-09-30 IMAGING — US US EXTREM LOW VENOUS*L*
1 series · 13 of 24 positions shown · non-contrast
Comparison: None.

CLINICAL DATA: Left lower extremity pain for the past 3 months.
Former smoker. Evaluate for DVT.



[Series 1: us extrem low venous*left* · 0.09mm/px · 13 of 33 slices shown]
[im 1/33]
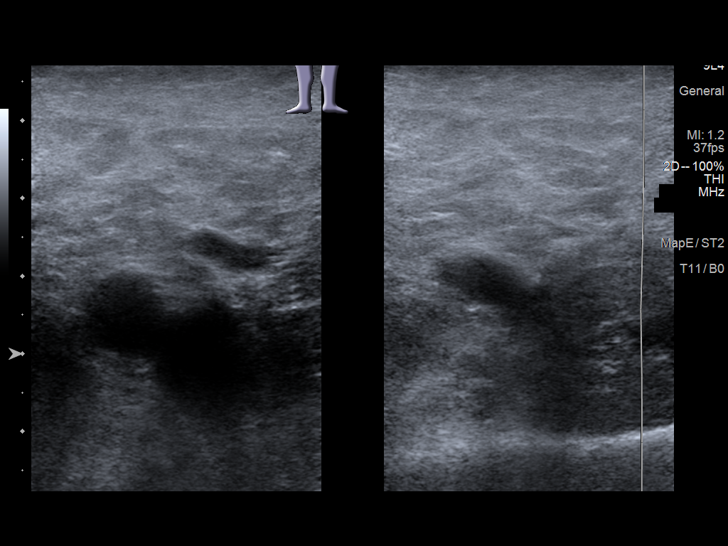
[im 3/33]
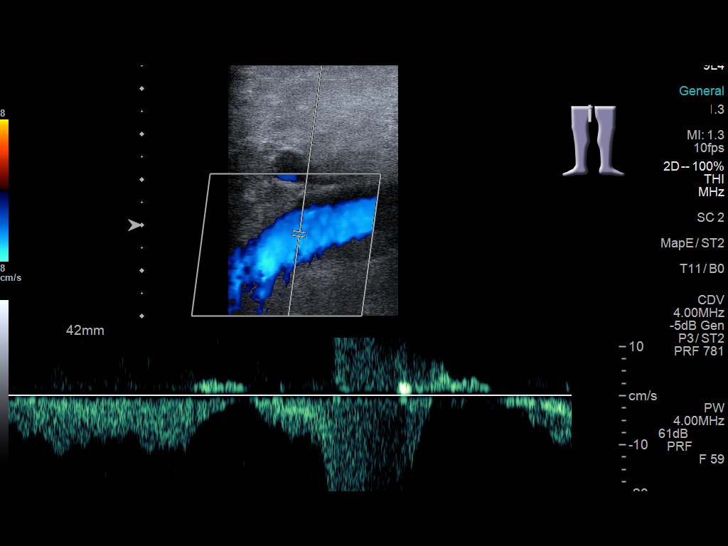
[im 6/33]
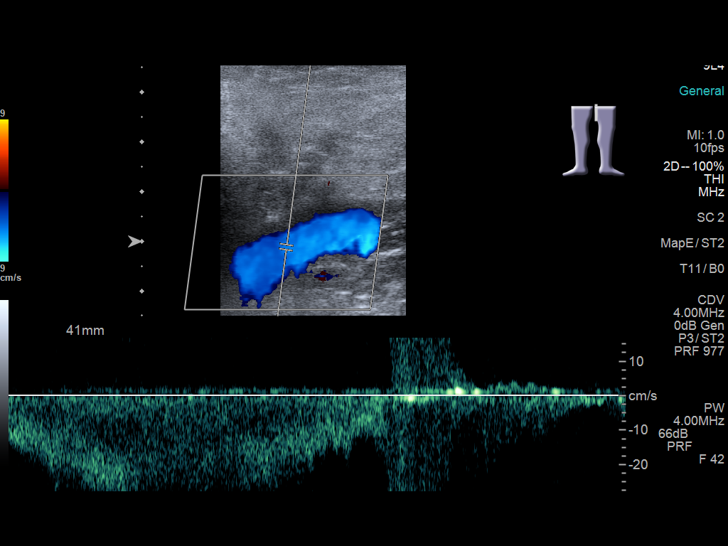
[im 9/33]
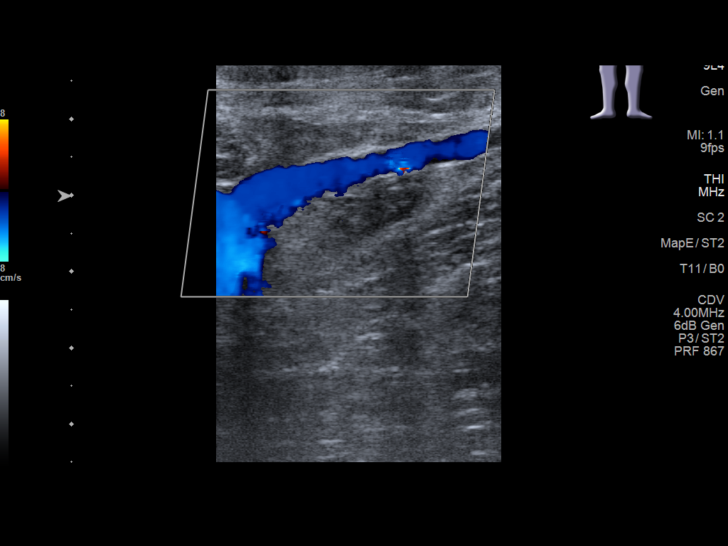
[im 12/33]
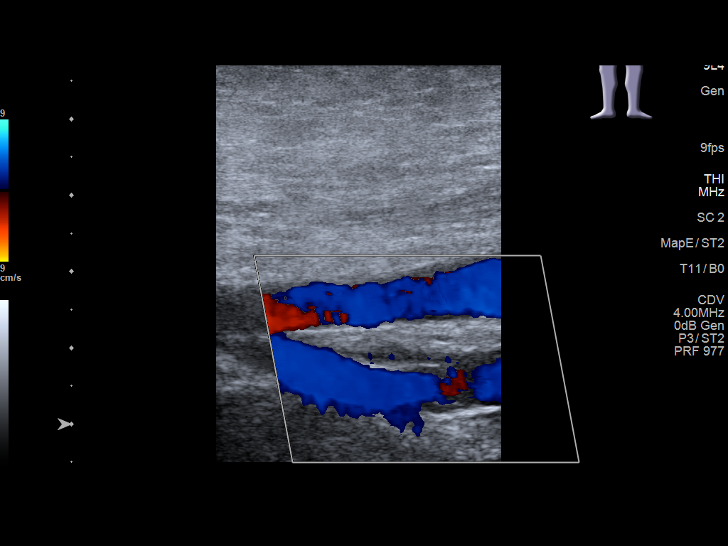
[im 14/33]
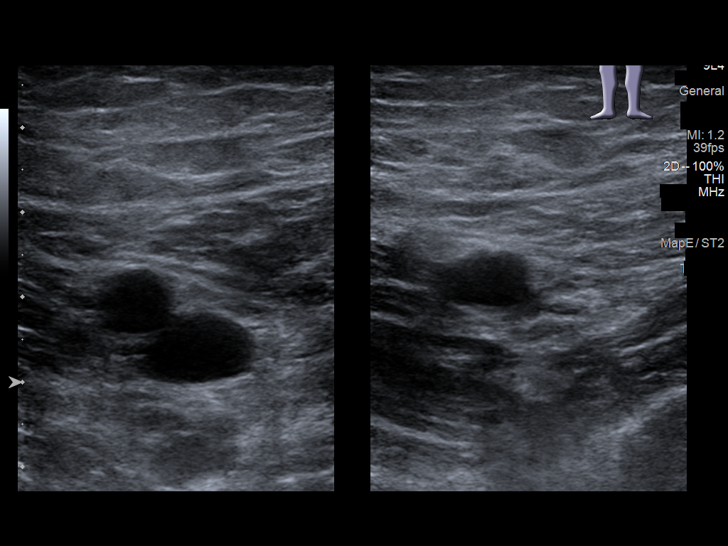
[im 17/33]
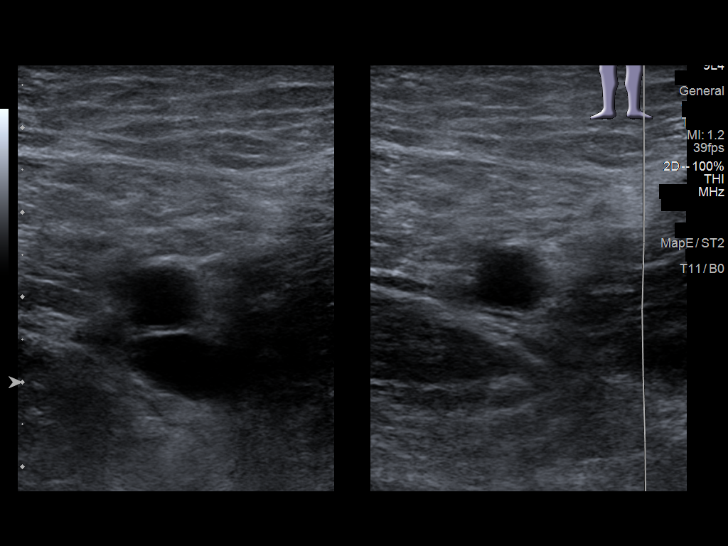
[im 19/33]
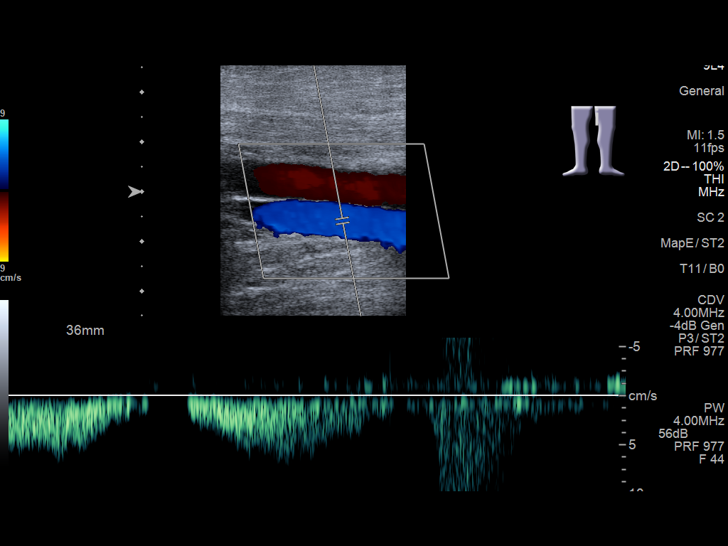
[im 21/33]
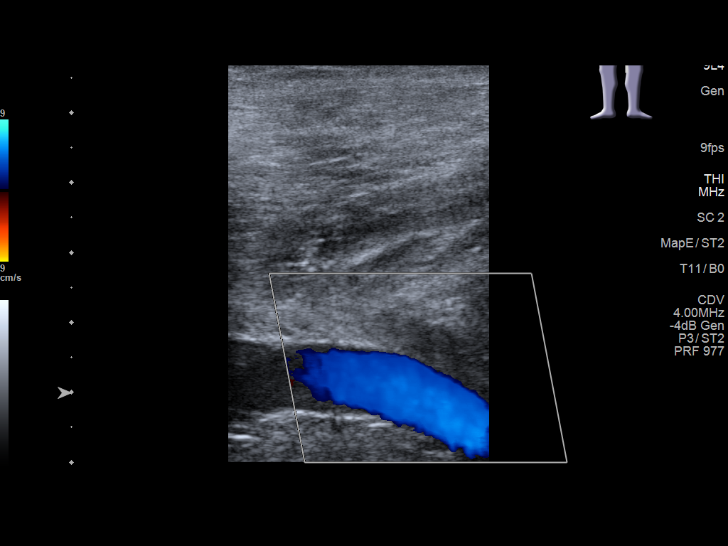
[im 24/33]
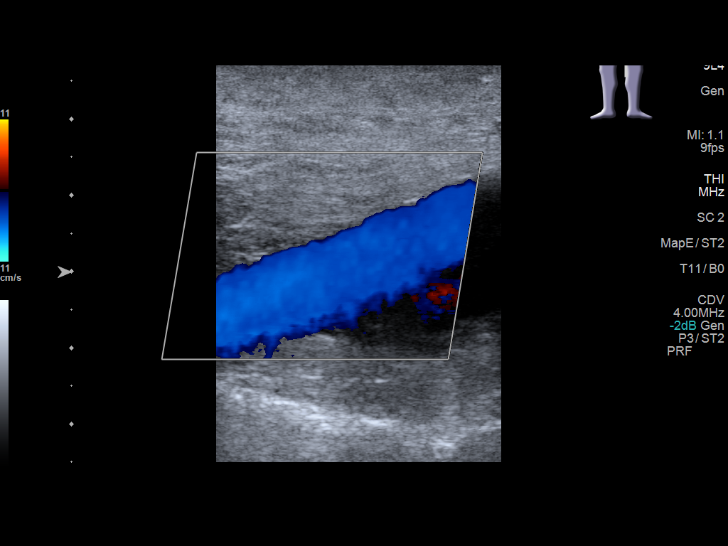
[im 27/33]
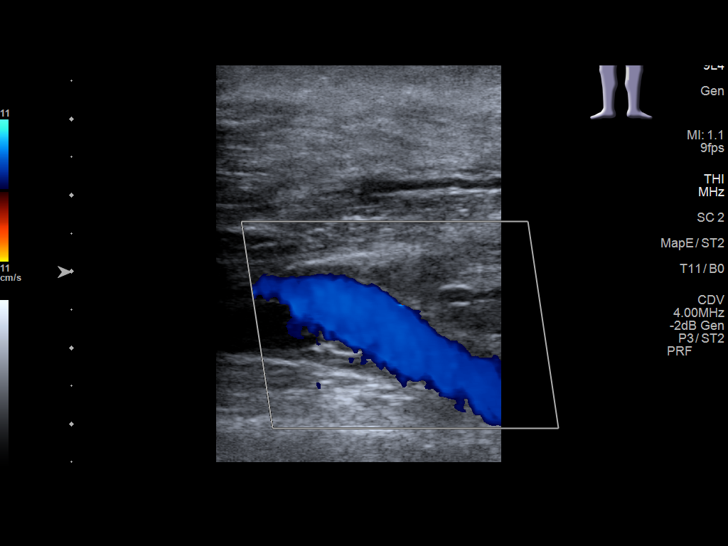
[im 30/33]
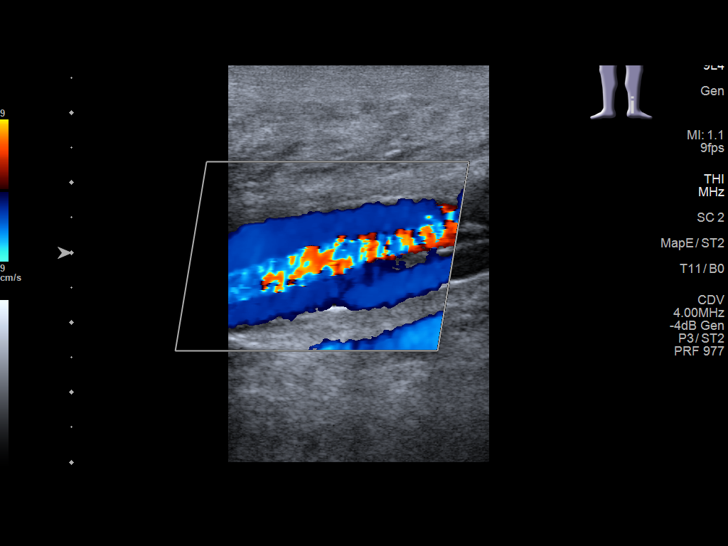
[im 33/33]
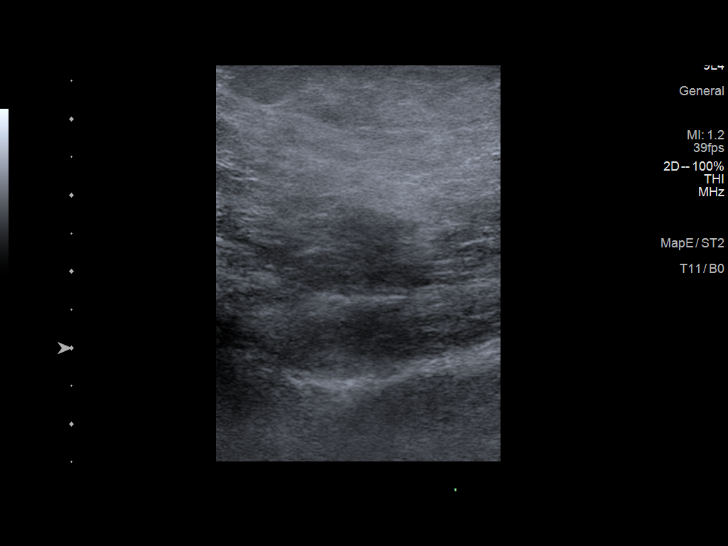

[13 of 24 positions shown; findings below may reference images not displayed]

FINDINGS: Contralateral Common Femoral Vein: Respiratory phasicity is normal
and symmetric with the symptomatic side. No evidence of thrombus.
Normal compressibility.

Common Femoral Vein: No evidence of thrombus. Normal
compressibility, respiratory phasicity and response to augmentation.

Saphenofemoral Junction: No evidence of thrombus. Normal
compressibility and flow on color Doppler imaging.

Profunda Femoral Vein: No evidence of thrombus. Normal
compressibility and flow on color Doppler imaging.

Femoral Vein: No evidence of thrombus. Normal compressibility,
respiratory phasicity and response to augmentation.

Popliteal Vein: No evidence of thrombus. Normal compressibility,
respiratory phasicity and response to augmentation.

Calf Veins: No evidence of thrombus. Normal compressibility and flow
on color Doppler imaging.

Superficial Great Saphenous Vein: No evidence of thrombus. Normal
compressibility.

Venous Reflux:  None.

Other Findings:  None.
IMPRESSION: No evidence of DVT within the left lower extremity.

## 2022-09-14 NOTE — H&P (Signed)
Pre-Procedure H&P   Patient ID: Andrew Marquez is a 71 y.o. male.  Gastroenterology Provider: Annamaria Helling, DO  Referring Provider: Dawson Bills, NP PCP: Idelle Crouch, MD  Date: 09/15/2022  HPI Andrew Marquez is a 71 y.o. male who presents today for Colonoscopy for Surveillance-personal history of colon polyps .  Patient with a personal history of polyps.  Last colonoscopy November 2018 only demonstrate internal hemorrhoids.  No family history of colon cancer or colon polyps.  Hemoglobin 14.6 MCV 88 platelets 178,000  03/2012 diverticulosis and internal hemorrhoids 11/2008-3 TA polyps and internal hemorrhoids 06/2001- 1 TA   Past Medical History:  Diagnosis Date   B12 deficiency    BPH (benign prostatic hyperplasia)    CAD (coronary artery disease)    CKD (chronic kidney disease)    STAGE III   Diabetes mellitus without complication ([place])    History of hiatal hernia    Hyperglycemia    Hypertension    Obesity    OSA (obstructive sleep apnea)    USES CPAP   Osteoarthritis    Tobacco abuse    Vitamin D deficiency     Past Surgical History:  Procedure Laterality Date   Cardiac Bypass     COLONOSCOPY     COLONOSCOPY WITH PROPOFOL N/A 07/24/2017   Procedure: COLONOSCOPY WITH PROPOFOL;  Surgeon: Manya Silvas, MD;  Location: [facility] ENDOSCOPY;  Service: Endoscopy;  Laterality: N/A;   CORONARY ARTERY BYPASS GRAFT  2013   1 VESSEL   FLEXIBLE SIGMOIDOSCOPY     SHOULDER ARTHROSCOPY WITH SUBACROMIAL DECOMPRESSION, ROTATOR CUFF REPAIR AND BICEP TENDON REPAIR Right 09/09/2020   Procedure: SHOULDER ARTHROSCOPY WITH DEBRIDEMENT, DECOMPRESSION, ROTATOR CUFF REPAIR AND BICEP TENDON REPAIR;  Surgeon: Corky Mull, MD;  Location: [facility] ORS;  Service: Orthopedics;  Laterality: Right;   TONSILLECTOMY     Transesophageal Echocardiography      Family History No h/o GI disease or malignancy  Review of Systems  Constitutional:  Negative for activity change,  appetite change, chills, diaphoresis, fatigue, fever and unexpected weight change.  HENT:  Negative for trouble swallowing and voice change.   Respiratory:  Negative for shortness of breath and wheezing.   Cardiovascular:  Negative for chest pain, palpitations and leg swelling.  Gastrointestinal:  Negative for abdominal distention, abdominal pain, anal bleeding, blood in stool, constipation, diarrhea, nausea and vomiting.  Musculoskeletal:  Negative for arthralgias and myalgias.  Skin:  Negative for color change and pallor.  Neurological:  Negative for dizziness, syncope and weakness.  Psychiatric/Behavioral:  Negative for confusion. The patient is not nervous/anxious.   All other systems reviewed and are negative.    Medications No current facility-administered medications on file prior to encounter.   Current Outpatient Medications on File Prior to Encounter  Medication Sig Dispense Refill   acetaminophen (TYLENOL) 500 MG tablet Take 1,000 mg by mouth every 8 (eight) hours as needed for mild pain.     amLODipine-benazepril (LOTREL) 5-40 MG capsule Take 1 capsule by mouth at bedtime.      aspirin EC 81 MG tablet Take 81 mg by mouth at bedtime.      atorvastatin (LIPITOR) 40 MG tablet Take 40 mg by mouth at bedtime.     cholecalciferol (VITAMIN D3) 25 MCG (1000 UNIT) tablet Take 1,000 Units by mouth at bedtime.     Cyanocobalamin (VITAMIN B-12) 5000 MCG TBDP Take 5,000 mcg by mouth at bedtime.     glimepiride (AMARYL) 4 MG tablet  Take 4 mg by mouth daily with breakfast.     Omega-3 Fatty Acids (FISH OIL) 1000 MG CAPS Take 1,000 mg by mouth at bedtime.     oxyCODONE (ROXICODONE) 5 MG immediate release tablet Take 1-2 tablets (5-10 mg total) by mouth every 4 (four) hours as needed for moderate pain or severe pain. 50 tablet 0    Pertinent medications related to GI and procedure were reviewed by me with the patient prior to the procedure   Current Facility-Administered Medications:     0.9 %  sodium chloride infusion, , Intravenous, Continuous, Annamaria Helling, DO, Last Rate: 20 mL/hr at 09/15/22 0720, New Bag at 09/15/22 0720      Allergies  Allergen Reactions   Mobic [Meloxicam] Other (See Comments)    Acid reflux   Allergies were reviewed by me prior to the procedure  Objective   Body mass index is 29.95 kg/m. Vitals:   09/15/22 0702  BP: (!) 153/77  Pulse: 69  Resp: 18  Temp: (!) 96.9 F (36.1 C)  TempSrc: Temporal  SpO2: 99%  Weight: 89.4 kg  Height: '5\' 8"'$  (1.727 m)     Physical Exam Vitals and nursing note reviewed.  Constitutional:      General: He is not in acute distress.    Appearance: Normal appearance. He is not ill-appearing, toxic-appearing or diaphoretic.  HENT:     Head: Normocephalic and atraumatic.     Nose: Nose normal.     Mouth/Throat:     Mouth: Mucous membranes are moist.     Pharynx: Oropharynx is clear.  Eyes:     General: No scleral icterus.    Extraocular Movements: Extraocular movements intact.  Cardiovascular:     Rate and Rhythm: Normal rate and regular rhythm.     Heart sounds: Normal heart sounds. No murmur heard.    No friction rub. No gallop.  Pulmonary:     Effort: Pulmonary effort is normal. No respiratory distress.     Breath sounds: Normal breath sounds. No wheezing, rhonchi or rales.  Abdominal:     General: Bowel sounds are normal. There is no distension.     Palpations: Abdomen is soft.     Tenderness: There is no abdominal tenderness. There is no guarding or rebound.  Musculoskeletal:     Cervical back: Neck supple.     Right lower leg: No edema.     Left lower leg: No edema.  Skin:    General: Skin is warm and dry.     Coloration: Skin is not jaundiced or pale.  Neurological:     General: No focal deficit present.     Mental Status: He is alert and oriented to person, place, and time. Mental status is at baseline.  Psychiatric:        Mood and Affect: Mood normal.        Behavior:  Behavior normal.        Thought Content: Thought content normal.        Judgment: Judgment normal.      Assessment:  Andrew Marquez is a 71 y.o. male  who presents today for Colonoscopy for Surveillance-personal history of colon polyps .  Plan:  Colonoscopy with possible intervention today  Colonoscopy with possible biopsy, control of bleeding, polypectomy, and interventions as necessary has been discussed with the patient/patient representative. Informed consent was obtained from the patient/patient representative after explaining the indication, nature, and risks of the procedure including but not limited  to death, bleeding, perforation, missed neoplasm/lesions, cardiorespiratory compromise, and reaction to medications. Opportunity for questions was given and appropriate answers were provided. Patient/patient representative has verbalized understanding is amenable to undergoing the procedure.   Annamaria Helling, DO  [facility]  Portions of the record may have been created with voice recognition software. Occasional wrong-word or 'sound-a-like' substitutions may have occurred due to the inherent limitations of voice recognition software.  Read the chart carefully and recognize, using context, where substitutions may have occurred.

## 2022-09-15 ENCOUNTER — Ambulatory Visit: Payer: Medicare Other | Admitting: Certified Registered"

## 2022-09-15 ENCOUNTER — Ambulatory Visit
Admission: RE | Admit: 2022-09-15 | Discharge: 2022-09-15 | Disposition: A | Discharge status: Home / Self Care | Payer: Medicare Other | Attending: Gastroenterology | Admitting: Gastroenterology

## 2022-09-15 ENCOUNTER — Encounter: Payer: Self-pay | Admitting: Gastroenterology

## 2022-09-15 ENCOUNTER — Other Ambulatory Visit: Payer: Self-pay

## 2022-09-15 ENCOUNTER — Encounter
Admission: RE | Disposition: A | Discharge status: Home / Self Care | Payer: Self-pay | Source: Home / Self Care | Attending: Gastroenterology

## 2022-09-15 DIAGNOSIS — Z951 Presence of aortocoronary bypass graft: Secondary | ICD-10-CM | POA: Diagnosis not present

## 2022-09-15 DIAGNOSIS — N183 Chronic kidney disease, stage 3 unspecified: Secondary | ICD-10-CM | POA: Insufficient documentation

## 2022-09-15 DIAGNOSIS — E1122 Type 2 diabetes mellitus with diabetic chronic kidney disease: Secondary | ICD-10-CM | POA: Diagnosis not present

## 2022-09-15 DIAGNOSIS — Z1211 Encounter for screening for malignant neoplasm of colon: Secondary | ICD-10-CM | POA: Insufficient documentation

## 2022-09-15 DIAGNOSIS — K5730 Diverticulosis of large intestine without perforation or abscess without bleeding: Secondary | ICD-10-CM | POA: Insufficient documentation

## 2022-09-15 DIAGNOSIS — Z7984 Long term (current) use of oral hypoglycemic drugs: Secondary | ICD-10-CM | POA: Diagnosis not present

## 2022-09-15 DIAGNOSIS — I129 Hypertensive chronic kidney disease with stage 1 through stage 4 chronic kidney disease, or unspecified chronic kidney disease: Secondary | ICD-10-CM | POA: Diagnosis not present

## 2022-09-15 DIAGNOSIS — K640 First degree hemorrhoids: Secondary | ICD-10-CM | POA: Diagnosis not present

## 2022-09-15 DIAGNOSIS — D122 Benign neoplasm of ascending colon: Secondary | ICD-10-CM | POA: Diagnosis not present

## 2022-09-15 DIAGNOSIS — K635 Polyp of colon: Secondary | ICD-10-CM | POA: Insufficient documentation

## 2022-09-15 DIAGNOSIS — I340 Nonrheumatic mitral (valve) insufficiency: Secondary | ICD-10-CM | POA: Insufficient documentation

## 2022-09-15 DIAGNOSIS — G4730 Sleep apnea, unspecified: Secondary | ICD-10-CM | POA: Insufficient documentation

## 2022-09-15 DIAGNOSIS — Z87891 Personal history of nicotine dependence: Secondary | ICD-10-CM | POA: Diagnosis not present

## 2022-09-15 HISTORY — PX: COLONOSCOPY WITH PROPOFOL: SHX5780

## 2022-09-15 LAB — GLUCOSE, CAPILLARY: Glucose-Capillary: 152 mg/dL — ABNORMAL HIGH (ref 70–99)

## 2022-09-15 SURGERY — COLONOSCOPY WITH PROPOFOL
Anesthesia: General

## 2022-09-15 MED ORDER — PROPOFOL 10 MG/ML IV BOLUS
INTRAVENOUS | Status: DC | PRN
  Administered 2022-09-15: 80 mg via INTRAVENOUS

## 2022-09-15 MED ORDER — PROPOFOL 1000 MG/100ML IV EMUL
INTRAVENOUS | Status: AC
  Filled 2022-09-15: qty 100

## 2022-09-15 MED ORDER — SODIUM CHLORIDE 0.9 % IV SOLN: INTRAVENOUS | Status: DC

## 2022-09-15 MED ORDER — PROPOFOL 500 MG/50ML IV EMUL
INTRAVENOUS | Status: DC | PRN
  Administered 2022-09-15: 150 ug/kg/min via INTRAVENOUS

## 2022-09-15 MED ORDER — LIDOCAINE HCL (CARDIAC) PF 100 MG/5ML IV SOSY
PREFILLED_SYRINGE | INTRAVENOUS | Status: DC | PRN
  Administered 2022-09-15: 50 mg via INTRAVENOUS

## 2022-09-15 NOTE — Op Note (Addendum)
[facility] Gastroenterology Patient Name: Andrew Marquez Procedure Date: 09/15/2022 7:38 AM MRN: [id number] Account #: [id number] Date of Birth: 1951/05/08 Admit Type: Outpatient Age: 71 Room: [facility] ENDO ROOM 1 Gender: Male Note Status: Finalized Instrument Name: Colonoscope [id number] Procedure:             Colonoscopy Indications:           High risk colon cancer surveillance: Personal history                         of colonic polyps Providers:             Rueben Bash, DO Referring MD:          Leonie Douglas. Doy Hutching, MD (Referring MD) Medicines:             Monitored Anesthesia Care Complications:         No immediate complications. Estimated blood loss:                         Minimal. Procedure:             Pre-Anesthesia Assessment:                        - Prior to the procedure, a History and Physical was                         performed, and patient medications and allergies were                         reviewed. The patient is competent. The risks and                         benefits of the procedure and the sedation options and                         risks were discussed with the patient. All questions                         were answered and informed consent was obtained.                         Patient identification and proposed procedure were                         verified by the physician, the nurse, the anesthetist                         and the technician in the endoscopy suite. Mental                         Status Examination: alert and oriented. Airway                         Examination: normal oropharyngeal airway and neck                         mobility. Respiratory Examination: clear to  auscultation. CV Examination: RRR, no murmurs, no S3                         or S4. Prophylactic Antibiotics: The patient does not                         require prophylactic antibiotics. Prior                          Anticoagulants: The patient has taken no anticoagulant                         or antiplatelet agents. ASA Grade Assessment: II - A                         patient with mild systemic disease. After reviewing                         the risks and benefits, the patient was deemed in                         satisfactory condition to undergo the procedure. The                         anesthesia plan was to use monitored anesthesia care                         (MAC). Immediately prior to administration of                         medications, the patient was re-assessed for adequacy                         to receive sedatives. The heart rate, respiratory                         rate, oxygen saturations, blood pressure, adequacy of                         pulmonary ventilation, and response to care were                         monitored throughout the procedure. The physical                         status of the patient was re-assessed after the                         procedure.                        After obtaining informed consent, the colonoscope was                         passed under direct vision. Throughout the procedure,                         the patient's blood pressure, pulse, and oxygen  saturations were monitored continuously. The                         Colonoscope was introduced through the anus and                         advanced to the the terminal ileum, with                         identification of the appendiceal orifice and IC                         valve. The colonoscopy was somewhat difficult due to                         multiple diverticula in the colon. Successful                         completion of the procedure was aided by withdrawing                         the scope and replacing with the pediatric colonoscope                         and lavage. The patient tolerated the procedure well.                         The quality of the  bowel preparation was evaluated                         using the BBPS [facility] Bowel Preparation Scale) with                         scores of: Right Colon = 2 (minor amount of residual                         staining, small fragments of stool and/or opaque                         liquid, but mucosa seen well), Transverse Colon = 3                         (entire mucosa seen well with no residual staining,                         small fragments of stool or opaque liquid) and Left                         Colon = 2 (minor amount of residual staining, small                         fragments of stool and/or opaque liquid, but mucosa                         seen well). The total BBPS score equals 7. The quality  of the bowel preparation was good. The terminal ileum,                         ileocecal valve, appendiceal orifice, and rectum were                         photographed. Findings:      The perianal and digital rectal examinations were normal. Pertinent       negatives include normal sphincter tone.      The terminal ileum appeared normal. Estimated blood loss: none.      Retroflexion in the right colon was performed.      Three sessile polyps were found in the ascending colon. The polyps were       3 to 6 mm in size. These polyps were removed with a cold snare.       Resection and retrieval were complete. Estimated blood loss was minimal.      A 1 to 2 mm polyp was found in the sigmoid colon. The polyp was sessile.       The polyp was removed with a jumbo cold forceps. Resection and retrieval       were complete. Estimated blood loss was minimal.      Multiple small-mouthed diverticula were found in the left colon. Lavage       of the area was performed using a moderate amount, resulting in       clearance with adequate visualization. Lavage performed to clear large       amounts mucus away.      Non-bleeding internal hemorrhoids were found during  retroflexion. The       hemorrhoids were Grade I (internal hemorrhoids that do not prolapse).       Estimated blood loss: none.      The exam was otherwise without abnormality on direct and retroflexion       views. Impression:            - The examined portion of the ileum was normal.                        - Three 3 to 6 mm polyps in the ascending colon,                         removed with a cold snare. Resected and retrieved.                        - One 1 to 2 mm polyp in the sigmoid colon, removed                         with a jumbo cold forceps. Resected and retrieved.                        - Diverticulosis in the left colon.                        - Non-bleeding internal hemorrhoids.                        - The examination was otherwise normal on direct and  retroflexion views. Recommendation:        - Patient has a contact number available for                         emergencies. The signs and symptoms of potential                         delayed complications were discussed with the patient.                         Return to normal activities tomorrow. Written                         discharge instructions were provided to the patient.                        - Discharge patient to home.                        - Resume previous diet.                        - Continue present medications.                        - Await pathology results.                        - No ibuprofen, naproxen, or other non-steroidal                         anti-inflammatory drugs for 5 days after polyp removal.                        - Resume ASA 81 tomorrow                        - Repeat colonoscopy for surveillance based on                         pathology results.                        - Return to referring physician as previously                         scheduled.                        - The findings and recommendations were discussed with                         the  patient. Procedure Code(s):     --- Professional ---                        [phone], Colonoscopy, flexible; with removal of                         tumor(s), polyp(s), or other lesion(s) by snare  technique                        45380, 59, Colonoscopy, flexible; with biopsy, single                         or multiple Diagnosis Code(s):     --- Professional ---                        Z86.010, Personal history of colonic polyps                        K64.0, First degree hemorrhoids                        D12.2, Benign neoplasm of ascending colon                        D12.5, Benign neoplasm of sigmoid colon                        K57.30, Diverticulosis of large intestine without                         perforation or abscess without bleeding CPT copyright 2022 American Medical Association. All rights reserved. The codes documented in this report are preliminary and upon coder review may  be revised to meet current compliance requirements. Attending Participation:      I personally performed the entire procedure. Volney American, DO Annamaria Helling DO, DO 09/15/2022 8:36:40 AM This report has been signed electronically. Number of Addenda: 0 Note Initiated On: 09/15/2022 7:38 AM Scope Withdrawal Time: 0 hours 25 minutes 3 seconds  Total Procedure Duration: 0 hours 41 minutes 58 seconds  Estimated Blood Loss:  Estimated blood loss was minimal.      [facility]

## 2022-09-15 NOTE — Anesthesia Procedure Notes (Signed)
Procedure Name: MAC Date/Time: 09/15/2022 7:46 AM  Performed by: Biagio Borg, CRNAPre-anesthesia Checklist: Patient identified, Emergency Drugs available, Suction available, Patient being monitored and Timeout performed Patient Re-evaluated:Patient Re-evaluated prior to induction Oxygen Delivery Method: Nasal cannula Induction Type: IV induction Placement Confirmation: positive ETCO2 and CO2 detector

## 2022-09-15 NOTE — Anesthesia Postprocedure Evaluation (Signed)
Anesthesia Post Note  Patient: Andrew Marquez  Procedure(s) Performed: COLONOSCOPY WITH PROPOFOL  Patient location during evaluation: Endoscopy Anesthesia Type: General Level of consciousness: awake and alert Pain management: pain level controlled Vital Signs Assessment: post-procedure vital signs reviewed and stable Respiratory status: spontaneous breathing, nonlabored ventilation, respiratory function stable and patient connected to nasal cannula oxygen Cardiovascular status: blood pressure returned to baseline and stable Postop Assessment: no apparent nausea or vomiting Anesthetic complications: no  No notable events documented.   Last Vitals:  Vitals:   09/15/22 0842 09/15/22 0852  BP: 124/73 127/79  Pulse: 65 64  Resp: 17 20  Temp:    SpO2: 100% 100%    Last Pain:  Vitals:   09/15/22 0852  TempSrc:   PainSc: 0-No pain                 Ilene Qua

## 2022-09-15 NOTE — Anesthesia Preprocedure Evaluation (Addendum)
Anesthesia Evaluation  Patient identified by MRN, date of birth, ID band Patient awake  General Assessment Comment:"Unable to intubate pt. Numerous attempt with Mcgrath and Glidescope. Even with Glidescope fiberoptic bronchoscope. Unable to visualize the cords. Able to ventilate pt with some difficulty using 2 hands on mask, during the entire time. Pt given sugamadex and paralysis resolved, moving air well on his own and transferred to PACU. "  Subsequently "Awake intubation with pt spontaneously breathing; Tamala Julian CRNA attempted to view VC with glidescope S4 however unable to visualize VC and pt coughing; Ramsdell MD performed awake nasal intubation using fiberoptic scope through right nare."  Reviewed: Allergy & Precautions, NPO status , Patient's Chart, lab work & pertinent test results  History of Anesthesia Complications (+) DIFFICULT AIRWAY and history of anesthetic complications  Airway Mallampati: III  TM Distance: >3 FB Neck ROM: full    Dental no notable dental hx.    Pulmonary sleep apnea and Continuous Positive Airway Pressure Ventilation , former smoker   Pulmonary exam normal        Cardiovascular hypertension, On Medications + CAD and + CABG  Normal cardiovascular exam+ Valvular Problems/Murmurs MR   ECHO (2021) INTERPRETATION  NORMAL LEFT VENTRICULAR SYSTOLIC FUNCTION   WITH MILD LVH  NORMAL RIGHT VENTRICULAR SYSTOLIC FUNCTION  MODERATE VALVULAR REGURGITATION (See above)  NO VALVULAR STENOSIS  MILD MR, TR, PR  EF 55-60%     Neuro/Psych negative neurological ROS  negative psych ROS   GI/Hepatic Neg liver ROS, hiatal hernia,,,  Endo/Other  diabetes, Oral Hypoglycemic Agents    Renal/GU Renal disease  negative genitourinary   Musculoskeletal  (+) Arthritis , Osteoarthritis,    Abdominal   Peds  Hematology negative hematology ROS (+)   Anesthesia Other Findings Past Medical History: No date: B12  deficiency No date: BPH (benign prostatic hyperplasia) No date: CAD (coronary artery disease) No date: CKD (chronic kidney disease)     Comment:  STAGE III No date: Diabetes mellitus without complication ([facility]) No date: History of hiatal hernia No date: Hyperglycemia No date: Hypertension No date: Obesity No date: OSA (obstructive sleep apnea)     Comment:  USES CPAP No date: Osteoarthritis No date: Tobacco abuse No date: Vitamin D deficiency  Past Surgical History: No date: Cardiac Bypass No date: COLONOSCOPY 07/24/2017: COLONOSCOPY WITH PROPOFOL; N/A     Comment:  Procedure: COLONOSCOPY WITH PROPOFOL;  Surgeon: Manya Silvas, MD;  Location: [facility] ENDOSCOPY;  Service:               Endoscopy;  Laterality: N/A; 2013: CORONARY ARTERY BYPASS GRAFT     Comment:  1 VESSEL No date: FLEXIBLE SIGMOIDOSCOPY 09/09/2020: SHOULDER ARTHROSCOPY WITH SUBACROMIAL DECOMPRESSION,  ROTATOR CUFF REPAIR AND BICEP TENDON REPAIR; Right     Comment:  Procedure: SHOULDER ARTHROSCOPY WITH DEBRIDEMENT,               DECOMPRESSION, ROTATOR CUFF REPAIR AND BICEP TENDON               REPAIR;  Surgeon: Corky Mull, MD;  Location: [facility] ORS;              Service: Orthopedics;  Laterality: Right; No date: TONSILLECTOMY No date: Transesophageal Echocardiography  BMI    Body Mass Index: 29.95 kg/m      Reproductive/Obstetrics negative OB ROS  Anesthesia Physical Anesthesia Plan  ASA: 2  Anesthesia Plan: General   Post-op Pain Management:    Induction: Intravenous  PONV Risk Score and Plan: Propofol infusion and TIVA  Airway Management Planned: Natural Airway and Nasal Cannula  Additional Equipment:   Intra-op Plan:   Post-operative Plan:   Informed Consent: I have reviewed the patients History and Physical, chart, labs and discussed the procedure including the risks, benefits and alternatives for the proposed anesthesia  with the patient or authorized representative who has indicated his/her understanding and acceptance.     Dental Advisory Given  Plan Discussed with: Anesthesiologist, CRNA and Surgeon  Anesthesia Plan Comments: (Patient consented for risks of anesthesia including but not limited to:  - adverse reactions to medications - risk of airway placement if required - damage to eyes, teeth, lips or other oral mucosa - nerve damage due to positioning  - sore throat or hoarseness - Damage to heart, brain, nerves, lungs, other parts of body or loss of life  Patient voiced understanding.)        Anesthesia Quick Evaluation

## 2022-09-15 NOTE — Interval H&P Note (Signed)
History and Physical Interval Note: Preprocedure H&P from 09/15/22  was reviewed and there was no interval change after seeing and examining the patient.  Written consent was obtained from the patient after discussion of risks, benefits, and alternatives. Patient has consented to proceed with Colonoscopy with possible intervention   09/15/2022 7:30 AM  Andrew Marquez  has presented today for surgery, with the diagnosis of Z86.010 - Hx of adenomatous colonic polyps.  The various methods of treatment have been discussed with the patient and family. After consideration of risks, benefits and other options for treatment, the patient has consented to  Procedure(s): COLONOSCOPY WITH PROPOFOL (N/A) as a surgical intervention.  The patient's history has been reviewed, patient examined, no change in status, stable for surgery.  I have reviewed the patient's chart and labs.  Questions were answered to the patient's satisfaction.     Annamaria Helling

## 2022-09-15 NOTE — Transfer of Care (Signed)
Immediate Anesthesia Transfer of Care Note  Patient: Andrew Marquez  Procedure(s) Performed: COLONOSCOPY WITH PROPOFOL  Patient Location: PACU and Endoscopy Unit  Anesthesia Type:General  Level of Consciousness: sedated  Airway & Oxygen Therapy: Patient Spontanous Breathing  Post-op Assessment: Report given to RN and Post -op Vital signs reviewed and stable  Post vital signs: Reviewed and stable  Last Vitals:  Vitals Value Taken Time  BP 113/73 09/15/22 0832  Temp    Pulse 66 09/15/22 0832  Resp 20 09/15/22 0832  SpO2 97 % 09/15/22 0832    Last Pain:  Vitals:   09/15/22 0832  TempSrc:   PainSc: Asleep         Complications: No notable events documented.

## 2022-09-16 ENCOUNTER — Encounter: Payer: Self-pay | Admitting: Gastroenterology

## 2022-09-16 LAB — SURGICAL PATHOLOGY

## 2022-09-16 NOTE — Progress Notes (Signed)
Was able to speak patient and his wife regarding procedure results.  They verbalized understanding and were appreciative of the phone call.  Ronne Binning, [facility]

## 2024-03-28 ENCOUNTER — Inpatient Hospital Stay: Admission: RE | Admit: 2024-03-28 | Source: Ambulatory Visit

## 2024-04-05 ENCOUNTER — Ambulatory Visit: Admit: 2024-04-05 | Admitting: Podiatry

## 2024-04-05 SURGERY — REPAIR, TENDON, ACHILLES
Anesthesia: Choice | Site: Foot | Laterality: Left
